# Patient Record
Sex: Male | Born: 1955 | Race: Black or African American | Hispanic: No | Marital: Married | Smoking: Former smoker
Health system: Southern US, Community
[De-identification: ages and names within clinical notes are randomized; demographics above are authoritative.]

## PROBLEM LIST (undated history)

## (undated) DIAGNOSIS — N529 Male erectile dysfunction, unspecified: Secondary | ICD-10-CM

## (undated) DIAGNOSIS — J309 Allergic rhinitis, unspecified: Secondary | ICD-10-CM

## (undated) DIAGNOSIS — I10 Essential (primary) hypertension: Secondary | ICD-10-CM

## (undated) DIAGNOSIS — J9801 Acute bronchospasm: Secondary | ICD-10-CM

## (undated) DIAGNOSIS — J45909 Unspecified asthma, uncomplicated: Secondary | ICD-10-CM

## (undated) DIAGNOSIS — E782 Mixed hyperlipidemia: Secondary | ICD-10-CM

## (undated) DIAGNOSIS — D869 Sarcoidosis, unspecified: Secondary | ICD-10-CM

## (undated) DIAGNOSIS — E291 Testicular hypofunction: Secondary | ICD-10-CM

## (undated) DIAGNOSIS — L309 Dermatitis, unspecified: Secondary | ICD-10-CM

## (undated) DIAGNOSIS — L209 Atopic dermatitis, unspecified: Secondary | ICD-10-CM

## (undated) DIAGNOSIS — M26629 Arthralgia of temporomandibular joint, unspecified side: Secondary | ICD-10-CM

## (undated) DIAGNOSIS — R202 Paresthesia of skin: Secondary | ICD-10-CM

## (undated) DIAGNOSIS — E1165 Type 2 diabetes mellitus with hyperglycemia: Secondary | ICD-10-CM

## (undated) HISTORY — DX: Unspecified asthma, uncomplicated: J45.909

## (undated) HISTORY — DX: Type 2 diabetes mellitus with hyperglycemia: E11.65

## (undated) HISTORY — DX: Male erectile dysfunction, unspecified: N52.9

## (undated) HISTORY — DX: Paresthesia of skin: R20.2

## (undated) HISTORY — DX: Testicular hypofunction: E29.1

## (undated) HISTORY — DX: Mixed hyperlipidemia: E78.2

## (undated) HISTORY — DX: Dermatitis, unspecified: L30.9

## (undated) HISTORY — DX: Essential (primary) hypertension: I10

## (undated) HISTORY — DX: Atopic dermatitis, unspecified: L20.9

## (undated) HISTORY — DX: Arthralgia of temporomandibular joint, unspecified side: M26.629

## (undated) HISTORY — DX: Allergic rhinitis, unspecified: J30.9

## (undated) HISTORY — DX: Acute bronchospasm: J98.01

## (undated) HISTORY — DX: Sarcoidosis, unspecified: D86.9

---

## 2002-09-14 ENCOUNTER — Encounter: Payer: Self-pay | Admitting: *Deleted

## 2002-09-14 ENCOUNTER — Ambulatory Visit (HOSPITAL_COMMUNITY): Admission: RE | Admit: 2002-09-14 | Discharge: 2002-09-14 | Payer: Self-pay | Admitting: *Deleted

## 2002-12-27 ENCOUNTER — Ambulatory Visit (HOSPITAL_BASED_OUTPATIENT_CLINIC_OR_DEPARTMENT_OTHER): Admission: RE | Admit: 2002-12-27 | Discharge: 2002-12-27 | Payer: Self-pay | Admitting: Orthopedic Surgery

## 2004-02-20 ENCOUNTER — Emergency Department (HOSPITAL_COMMUNITY): Admission: EM | Admit: 2004-02-20 | Discharge: 2004-02-20 | Payer: Self-pay | Admitting: Emergency Medicine

## 2006-10-13 ENCOUNTER — Encounter: Admission: RE | Admit: 2006-10-13 | Discharge: 2007-01-11 | Payer: Self-pay | Admitting: Family Medicine

## 2008-07-01 ENCOUNTER — Emergency Department (HOSPITAL_BASED_OUTPATIENT_CLINIC_OR_DEPARTMENT_OTHER): Admission: EM | Admit: 2008-07-01 | Discharge: 2008-07-01 | Payer: Self-pay | Admitting: Emergency Medicine

## 2008-07-01 ENCOUNTER — Ambulatory Visit: Payer: Self-pay | Admitting: Interventional Radiology

## 2010-10-15 LAB — BASIC METABOLIC PANEL
BUN: 18 mg/dL (ref 6–23)
CO2: 30 mEq/L (ref 19–32)
Calcium: 9 mg/dL (ref 8.4–10.5)
Chloride: 102 mEq/L (ref 96–112)
Creatinine, Ser: 1 mg/dL (ref 0.4–1.5)
GFR calc Af Amer: >60 mL/min (ref >60)
GFR calc non Af Amer: >60 mL/min (ref >60)
Glucose, Bld: 102 mg/dL — ABNORMAL HIGH (ref 70–99)
Potassium: 3.4 mEq/L — ABNORMAL LOW (ref 3.5–5.1)
Sodium: 143 mEq/L (ref 135–145)

## 2010-10-15 LAB — DIFFERENTIAL
Basophils Absolute: 0 10*3/uL (ref 0.0–0.1)
Basophils Relative: 1 % (ref 0–1)
Eosinophils Absolute: 0.1 10*3/uL (ref 0.0–0.7)
Eosinophils Relative: 3 % (ref 0–5)
Lymphocytes Relative: 45 % (ref 12–46)
Lymphs Abs: 2.1 10*3/uL (ref 0.7–4.0)
Monocytes Absolute: 0.3 10*3/uL (ref 0.1–1.0)
Monocytes Relative: 8 % (ref 3–12)
Neutro Abs: 1.9 10*3/uL (ref 1.7–7.7)
Neutrophils Relative %: 44 % (ref 43–77)

## 2010-10-15 LAB — CBC
HCT: 34.7 % — ABNORMAL LOW (ref 39.0–52.0)
Hemoglobin: 12.2 g/dL — ABNORMAL LOW (ref 13.0–17.0)
MCHC: 35.1 g/dL (ref 30.0–36.0)
MCV: 84.3 fL (ref 78.0–100.0)
Platelets: 235 10*3/uL (ref 150–400)
RBC: 4.12 MIL/uL — ABNORMAL LOW (ref 4.22–5.81)
RDW: 11.4 % — ABNORMAL LOW (ref 11.5–15.5)
WBC: 4.4 10*3/uL (ref 4.0–10.5)

## 2010-10-15 LAB — URINALYSIS, ROUTINE W REFLEX MICROSCOPIC
Bilirubin Urine: NEGATIVE
Glucose, UA: NEGATIVE mg/dL
Hgb urine dipstick: NEGATIVE
Ketones, ur: 15 mg/dL — AB
Leukocytes, UA: NEGATIVE
Nitrite: NEGATIVE
Protein, ur: 30 mg/dL — AB
Specific Gravity, Urine: 1.033 — ABNORMAL HIGH (ref 1.005–1.030)
Urobilinogen, UA: 2 mg/dL — ABNORMAL HIGH (ref 0.0–1.0)
pH: 6 (ref 5.0–8.0)

## 2010-10-15 LAB — URINE MICROSCOPIC-ADD ON

## 2010-10-15 LAB — URINE CULTURE
Colony Count: NO GROWTH
Culture: NO GROWTH

## 2010-10-15 LAB — LIPASE, BLOOD: Lipase: 133 U/L (ref 23–300)

## 2010-11-16 NOTE — Op Note (Signed)
NAME:  Andrew Parker, Andrew Parker                            ACCOUNT NO.:  [id number]   MEDICAL RECORD NO.:  [id number]                   PATIENT TYPE:  AMB   LOCATION:  DSC                                  FACILITY:  [facility]   PHYSICIAN:  Feliberto Gottron. Turner Daniels, M.D.                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   POSTOPERATIVE DIAGNOSES:  Right shoulder rotator cuff partial tear with  impingement syndrome.   OPERATION PERFORMED:  Right shoulder arthroscopic anterior inferior  acromioplasty.  Excision of distal clavicle spur and debridement of internal  leaflet rotator cuff tear.   SURGEON:  Feliberto Gottron. Turner Daniels, M.D.   ASSISTANTLaural Benes. Jannet Mantis.   ANESTHESIA:  Interscalene block plus general endotracheal.   ESTIMATED BLOOD LOSS:  Minimal.   FLUIDS REPLACED:  of crystalloid.   DRAINS:  None.   TOURNIQUET TIME:  None.   INDICATIONS FOR PROCEDURE:  The patient is a 55 year old man who fell off a  ladder in February of 2004 and has had right shoulder pain ever since  consistent with impingement syndrome. MRI scan showed a 1 cm retracted  rotator cuff tear and he failed conservative treatment.  He is retired from  his primary job at this point and is doing work around his house and he has  symptoms when he attempts overhead activities.  He has failed conservative  treatment with observation and exercise. He desires elective arthroscopic  evaluation and treatment of his right shoulder.   DESCRIPTION OF PROCEDURE:  The patient was identified by arm band and taken  to the operating room at [facility] where appropriate  anesthetic monitors are attached and interscalene block anesthesia induced  into the right upper extremity followed by general endotracheal anesthesia.  He was then placed  in the beach chair position and the right upper extremity  was prepped and draped in the usual sterile fashion from the wrist to the  hemithorax.  Using a #11 blade, we then made standard portals 1.5 cm  anterior to the acromioclavicular joint lateral to the junction of the  middle and posterior thirds of the acromion and posterior to the  posterolateral corner of the acromion process.  The inflow was placed  anteriorly.  The arthroscope laterally and a 4.2 great white sucker shaver  posteriorly.  This allowed removal of inflamed, thickened subacromial bursa,  outlining of the subclavicular and subacromial spurs which were then removed  with a 4.5 hooded vortex bur approaching from the posterior portal and  creating a type 1 acromion.  The [facility] joint still had a layer of cartilage in  it and we did not proceed with a distal clavicle excision.  The rotator  cuff  was carefully evaluated and probed and no full thickness tears externally  were noted.  The arthroscope was then repositioned into the glenohumeral  joint using the posterior portal with the inflow attached.  The labrum had  some minor tearing and was lightly debrided.  The biceps and biceps anchor  were intact.  The subscap was intact.  The rotator cuff had an internal  leaflet partial thickness tear that was not full thickness and in my opinion  was less than 50% torn.  This was debrided back to a stable margin and once  again there was a watertight seal through the rotator cuff.  At this point  the shoulder was irrigated with normal saline solution using the arthroscope  and the instruments were removed.  A dressing of Xeroform, 4 x 4 dressing  sponges and HypaFix tape.  Patient was placed in a sling, awakened and taken  to the recovery room without difficulty.                                                  Feliberto Gottron. Turner Daniels, M.D.    Ovid Curd  D:  12/27/2002  T:  12/27/2002  Job:  [id number]

## 2011-04-12 ENCOUNTER — Other Ambulatory Visit: Payer: Self-pay | Admitting: Family Medicine

## 2011-04-12 DIAGNOSIS — M25512 Pain in left shoulder: Secondary | ICD-10-CM

## 2011-04-16 ENCOUNTER — Ambulatory Visit
Admission: RE | Admit: 2011-04-16 | Discharge: 2011-04-16 | Disposition: A | Discharge status: Home / Self Care | Payer: BC Managed Care – PPO | Source: Ambulatory Visit | Attending: Family Medicine | Admitting: Family Medicine

## 2011-04-16 DIAGNOSIS — M25512 Pain in left shoulder: Secondary | ICD-10-CM

## 2017-03-17 ENCOUNTER — Ambulatory Visit
Admission: RE | Admit: 2017-03-17 | Discharge: 2017-03-17 | Disposition: A | Discharge status: Home / Self Care | Payer: BLUE CROSS/BLUE SHIELD | Source: Ambulatory Visit | Attending: Family Medicine | Admitting: Family Medicine

## 2017-03-17 ENCOUNTER — Other Ambulatory Visit: Payer: Self-pay | Admitting: Family Medicine

## 2017-03-17 DIAGNOSIS — R1031 Right lower quadrant pain: Secondary | ICD-10-CM

## 2018-05-25 ENCOUNTER — Other Ambulatory Visit: Payer: Self-pay | Admitting: Family Medicine

## 2018-05-25 ENCOUNTER — Ambulatory Visit
Admission: RE | Admit: 2018-05-25 | Discharge: 2018-05-25 | Disposition: A | Discharge status: Home / Self Care | Payer: BLUE CROSS/BLUE SHIELD | Source: Ambulatory Visit | Attending: Family Medicine | Admitting: Family Medicine

## 2018-05-25 DIAGNOSIS — R634 Abnormal weight loss: Secondary | ICD-10-CM

## 2018-05-25 DIAGNOSIS — M545 Low back pain, unspecified: Secondary | ICD-10-CM

## 2018-05-26 ENCOUNTER — Other Ambulatory Visit: Payer: Self-pay | Admitting: Family Medicine

## 2018-05-26 DIAGNOSIS — R634 Abnormal weight loss: Secondary | ICD-10-CM

## 2018-05-26 DIAGNOSIS — R748 Abnormal levels of other serum enzymes: Secondary | ICD-10-CM

## 2018-06-02 ENCOUNTER — Ambulatory Visit
Admission: RE | Admit: 2018-06-02 | Discharge: 2018-06-02 | Disposition: A | Discharge status: Home / Self Care | Payer: BLUE CROSS/BLUE SHIELD | Source: Ambulatory Visit | Attending: Family Medicine | Admitting: Family Medicine

## 2018-06-02 DIAGNOSIS — R748 Abnormal levels of other serum enzymes: Secondary | ICD-10-CM

## 2018-06-02 DIAGNOSIS — R634 Abnormal weight loss: Secondary | ICD-10-CM

## 2018-06-02 MED ORDER — IOHEXOL 300 MG/ML  SOLN
100.0000 mL | Freq: Once | INTRAMUSCULAR | Status: AC | PRN
  Administered 2018-06-02: 100 mL via INTRAVENOUS

## 2019-12-09 DIAGNOSIS — D649 Anemia, unspecified: Secondary | ICD-10-CM | POA: Diagnosis not present

## 2019-12-09 DIAGNOSIS — E1169 Type 2 diabetes mellitus with other specified complication: Secondary | ICD-10-CM | POA: Diagnosis not present

## 2019-12-09 DIAGNOSIS — I10 Essential (primary) hypertension: Secondary | ICD-10-CM | POA: Diagnosis not present

## 2019-12-09 DIAGNOSIS — E782 Mixed hyperlipidemia: Secondary | ICD-10-CM | POA: Diagnosis not present

## 2020-06-15 DIAGNOSIS — Z0000 Encounter for general adult medical examination without abnormal findings: Secondary | ICD-10-CM | POA: Diagnosis not present

## 2020-06-15 DIAGNOSIS — E1165 Type 2 diabetes mellitus with hyperglycemia: Secondary | ICD-10-CM | POA: Diagnosis not present

## 2020-06-15 DIAGNOSIS — I10 Essential (primary) hypertension: Secondary | ICD-10-CM | POA: Diagnosis not present

## 2020-06-15 DIAGNOSIS — E782 Mixed hyperlipidemia: Secondary | ICD-10-CM | POA: Diagnosis not present

## 2020-11-22 IMAGING — CT CT ABD-PELV W/ CM
1 of 3 series · 14 of 32 positions shown, 19 images · IV contrast (APPLIED)
Comparison: 07/01/2008

CLINICAL DATA: Weight loss. Elevated lipase. Right lateral thigh
pain and numbness.

EXAM:
CT ABDOMEN AND PELVIS WITH CONTRAST
TECHNIQUE: Multidetector CT imaging of the abdomen and pelvis was performed
using the standard protocol following bolus administration of
intravenous contrast.
CONTRAST:  100mL OMNIPAQUE IOHEXOL 300 MG/ML  SOLN

[Series 2: abd/pelvis w/cm · axial · 0.77mm/px · z∈[-494,-74]mm · 14 of 98 slices shown, 19 images]
[im 7/98  soft-tissue]
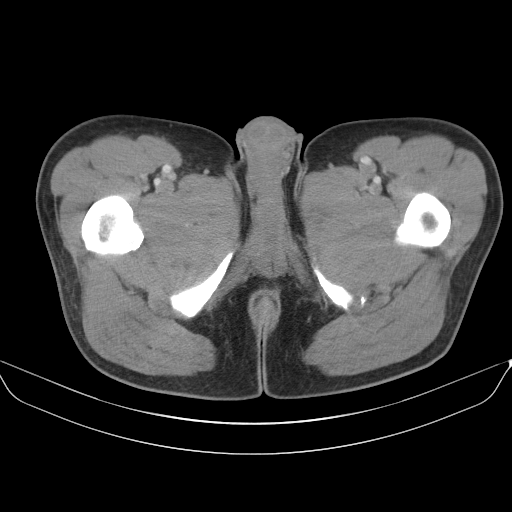
[im 7/98  bone]
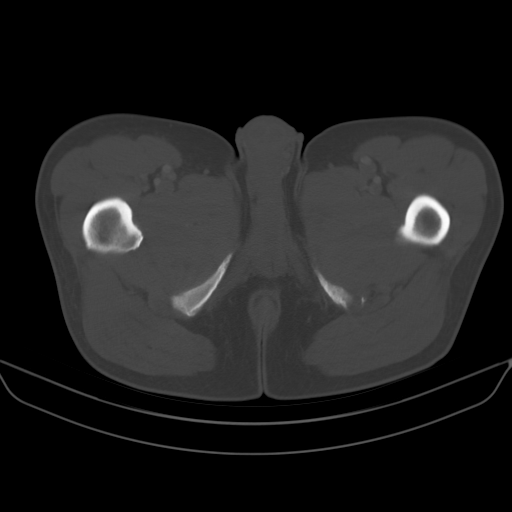
[im 13/98  soft-tissue]
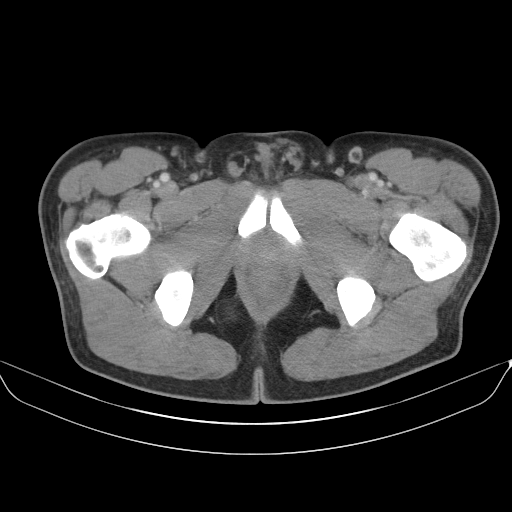
[im 19/98  soft-tissue]
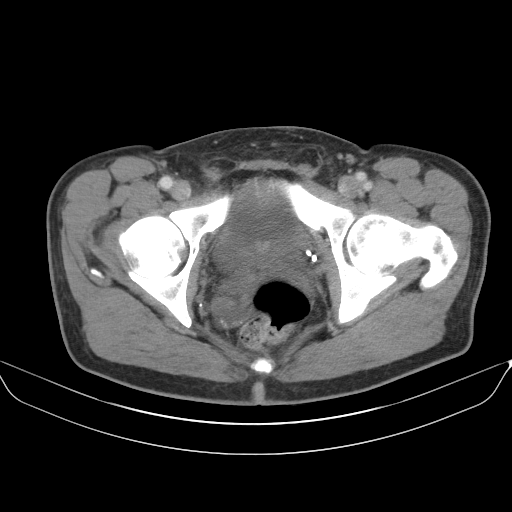
[im 31/98  soft-tissue]
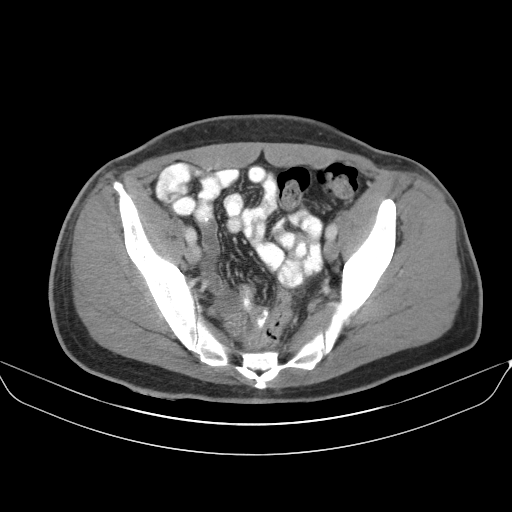
[im 37/98  soft-tissue]
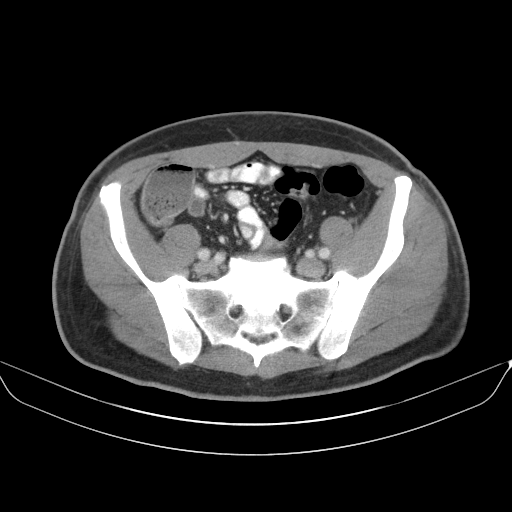
[im 43/98  soft-tissue]
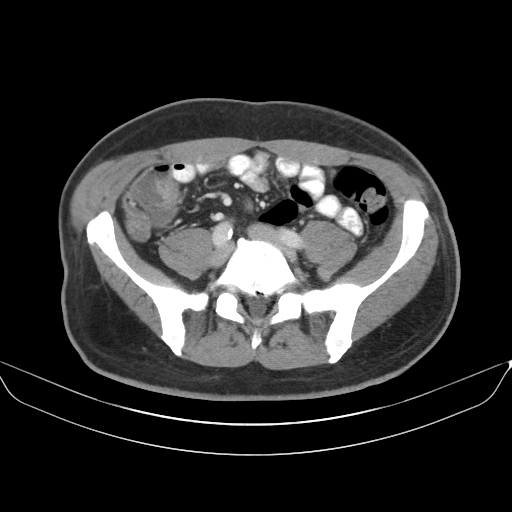
[im 49/98  soft-tissue]
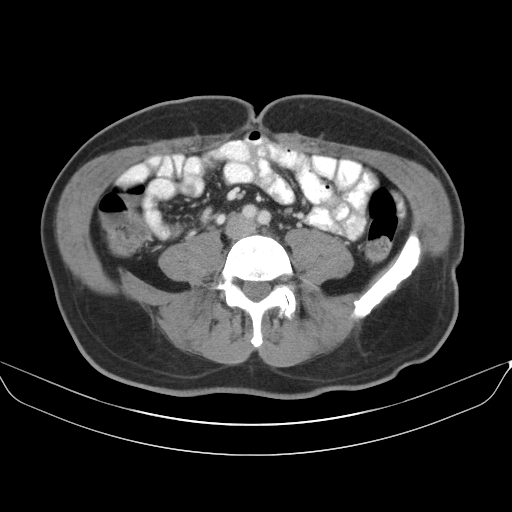
[im 55/98  soft-tissue]
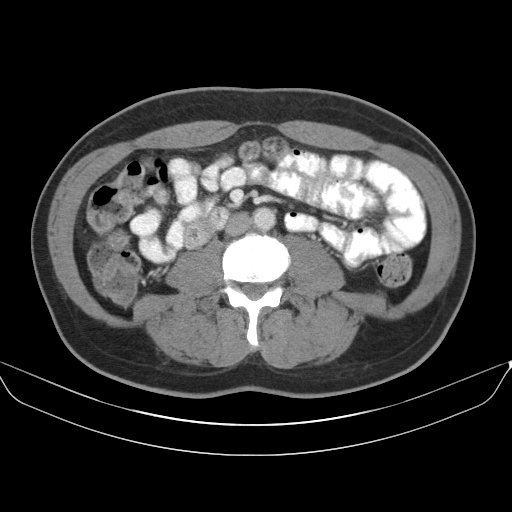
[im 61/98  soft-tissue]
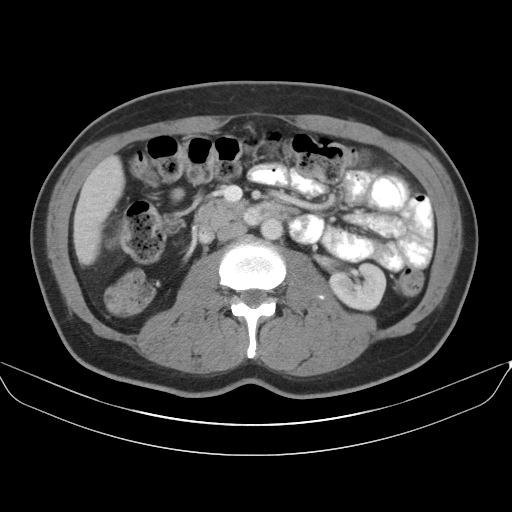
[im 61/98  bone]
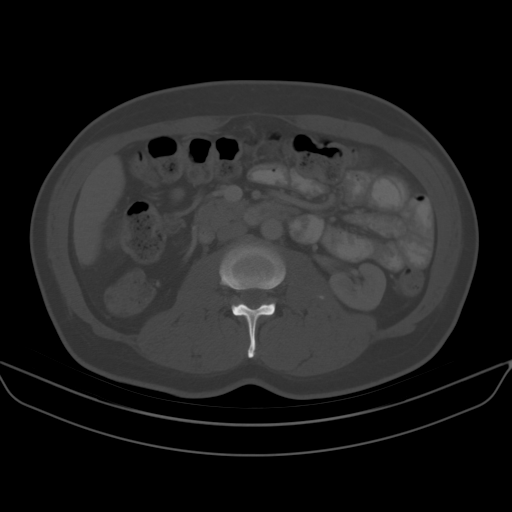
[im 67/98  soft-tissue]
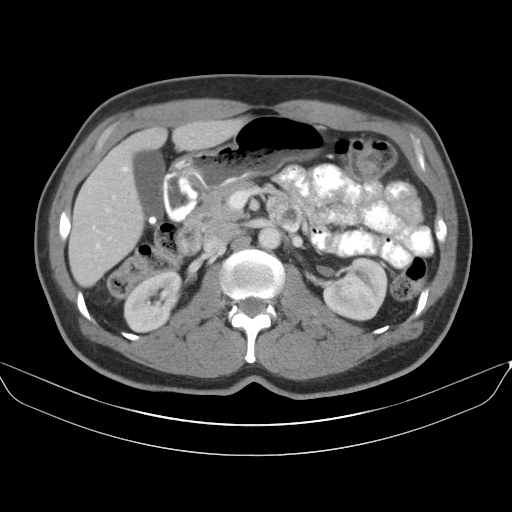
[im 73/98  lung]
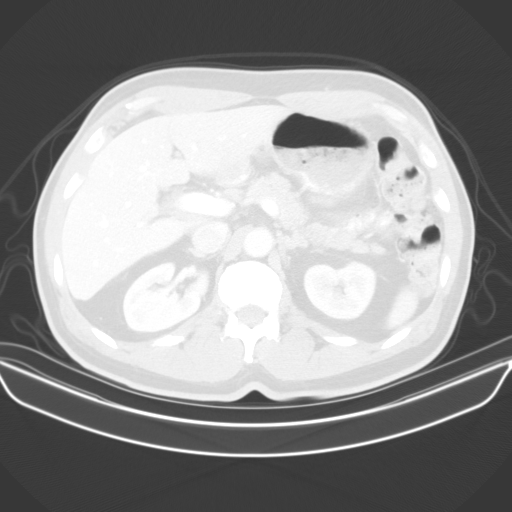
[im 79/98  soft-tissue]
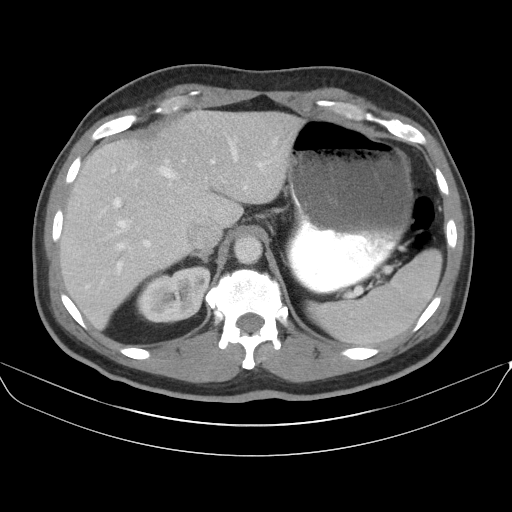
[im 79/98  lung]
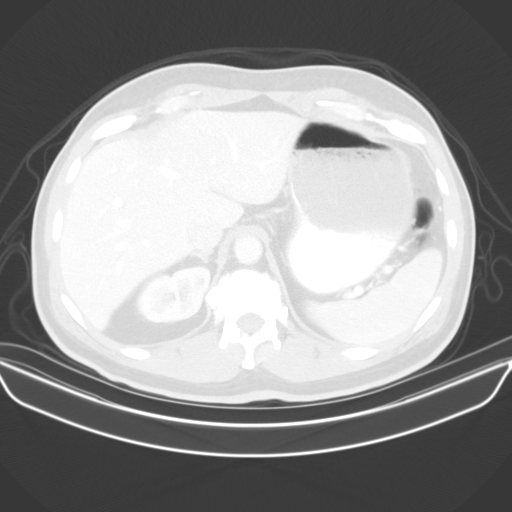
[im 85/98  soft-tissue]
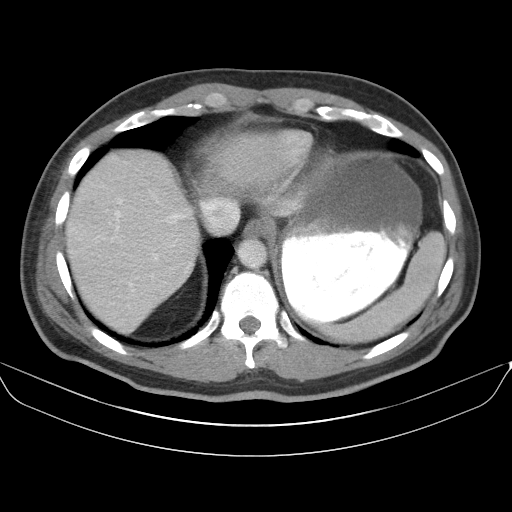
[im 85/98  lung]
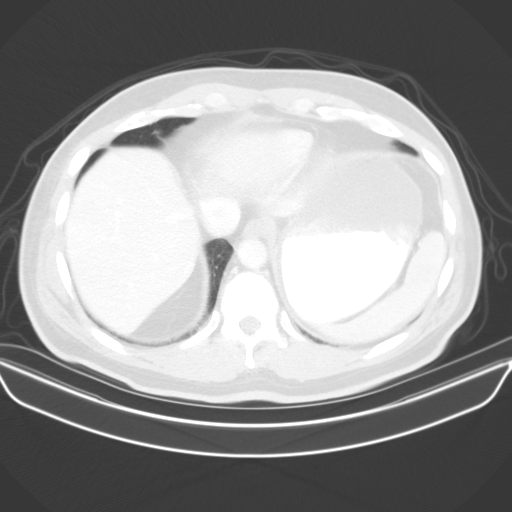
[im 91/98  soft-tissue]
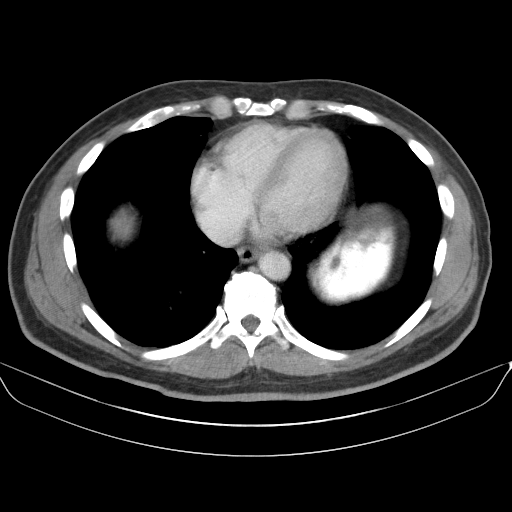
[im 91/98  lung]
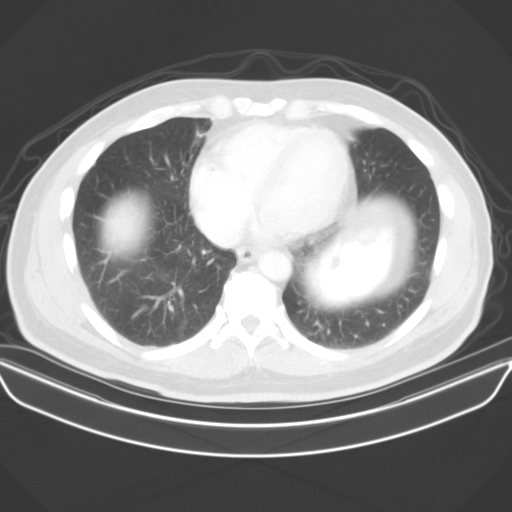

[14 of 32 positions shown; findings below may reference images not displayed]

FINDINGS: Lower chest: No acute abnormality.

Hepatobiliary: No focal liver abnormality is seen. Gallstones noted
measuring up to 9mm. No gallbladder wall thickening or
pericholecystic fluid.

Pancreas: Unremarkable. No pancreatic ductal dilatation or
surrounding inflammatory changes.

Spleen: Normal in size without focal abnormality.

Adrenals/Urinary Tract: Adrenal glands are unremarkable. Small
bilateral low attention foci noted measuring less than 10mm, too
small to characterize. Kidneys are otherwise normal, without renal
calculi, focal lesion, or hydronephrosis. Bladder is unremarkable.

Stomach/Bowel: Stomach appears normal. Small bowel loops have a
normal course and caliber. No small bowel wall thickening or
inflammation. The appendix is visualized and appears normal.
Unremarkable appearance of the colon.

Vascular/Lymphatic: No significant vascular findings are present. No
enlarged abdominal or pelvic lymph nodes.

Reproductive: Prostate is unremarkable.

Other: No abdominal wall hernia or abnormality. No abdominopelvic
ascites.

Musculoskeletal: No acute or significant osseous findings. L5-S1
degenerative disc disease noted.
IMPRESSION: 1. No acute findings within the abdomen or pelvis.
2. Gallstones.
3. No mass or adenopathy identified to explain patient's loss of
weight.

## 2020-12-07 DIAGNOSIS — R0602 Shortness of breath: Secondary | ICD-10-CM | POA: Diagnosis not present

## 2020-12-07 DIAGNOSIS — R0789 Other chest pain: Secondary | ICD-10-CM | POA: Diagnosis not present

## 2020-12-11 DIAGNOSIS — R0600 Dyspnea, unspecified: Secondary | ICD-10-CM | POA: Diagnosis not present

## 2020-12-11 DIAGNOSIS — J45909 Unspecified asthma, uncomplicated: Secondary | ICD-10-CM | POA: Diagnosis not present

## 2021-01-03 ENCOUNTER — Ambulatory Visit: Payer: Medicare Other | Admitting: Interventional Cardiology

## 2021-01-03 ENCOUNTER — Other Ambulatory Visit: Payer: Self-pay

## 2021-01-03 ENCOUNTER — Encounter: Payer: Self-pay | Admitting: Interventional Cardiology

## 2021-01-03 VITALS — BP 136/90 | HR 73 | Ht 69.0 in | Wt 204.2 lb

## 2021-01-03 DIAGNOSIS — E782 Mixed hyperlipidemia: Secondary | ICD-10-CM

## 2021-01-03 DIAGNOSIS — R072 Precordial pain: Secondary | ICD-10-CM

## 2021-01-03 DIAGNOSIS — I10 Essential (primary) hypertension: Secondary | ICD-10-CM

## 2021-01-03 DIAGNOSIS — E119 Type 2 diabetes mellitus without complications: Secondary | ICD-10-CM | POA: Diagnosis not present

## 2021-01-03 MED ORDER — METOPROLOL TARTRATE 100 MG PO TABS: ORAL_TABLET | ORAL | 0 refills | Status: DC

## 2021-01-03 NOTE — Patient Instructions (Addendum)
Medication Instructions:  Your physician recommends that you continue on your current medications as directed. Please refer to the Current Medication list given to you today.  *If you need a refill on your cardiac medications before your next appointment, please call your pharmacy*   Lab Work: Lab work to be done today--BMP If you have labs (blood work) drawn today and your tests are completely normal, you will receive your results only by: MyChart Message (if you have MyChart) OR A paper copy in the mail If you have any lab test that is abnormal or we need to change your treatment, we will call you to review the results.   Testing/Procedures: Your physician has requested that you have cardiac CT. Cardiac computed tomography (CT) is a painless test that uses an x-ray machine to take clear, detailed pictures of your heart. For further information please visit [email or web address] Please follow instruction sheet as given.     Follow-Up: At [facility], you and your health needs are our priority.  As part of our continuing mission to provide you with exceptional heart care, we have created designated Provider Care Teams.  These Care Teams include your primary Cardiologist (physician) and Advanced Practice Providers (APPs -  Physician Assistants and Nurse Practitioners) who all work together to provide you with the care you need, when you need it.  We recommend signing up for the patient portal called "MyChart".  Sign up information is provided on this After Visit Summary.  MyChart is used to connect with patients for Virtual Visits (Telemedicine).  Patients are able to view lab/test results, encounter notes, upcoming appointments, etc.  Non-urgent messages can be sent to your provider as well.   To learn more about what you can do with MyChart, go to [email or web address].    Your next appointment:   Based on test results  The format for your next appointment:   In Person  Provider:    You may see Lance Muss, MD or one of the following Advanced Practice Providers on your designated Care Team:   Ronie Spies, PA-C Jacolyn Reedy, PA-C   Other Instructions     Your cardiac CT will be scheduled at one of the below locations:   [facility] [address] [place], [place] [address] [phone]  OR  [facility] [address] [place], [place] [address] [phone]  If scheduled at [facility], please arrive at the [facility] main entrance (entrance A) of [facility] 30 minutes prior to test start time. Proceed to the [facility] Radiology Department (first floor) to check-in and test prep.  If scheduled at [facility], please arrive 15 mins early for check-in and test prep.  Please follow these instructions carefully (unless otherwise directed):  Hold all erectile dysfunction medications at least 3 days (72 hrs) prior to test.  On the Night Before the Test: Be sure to Drink plenty of water. Do not consume any caffeinated/decaffeinated beverages or chocolate 12 hours prior to your test. Do not take any antihistamines 12 hours prior to your test.   On the Day of the Test: Drink plenty of water until 1 hour prior to the test. Do not eat any food 4 hours prior to the test. You may take your regular medications prior to the test.  Take metoprolol (Lopressor) two hours prior to test.--Dose is 100 mg HOLD Furosemide/Hydrochlorothiazide morning of the test.       After the  Test: Drink plenty of water. After receiving IV contrast, you may experience a mild flushed feeling. This is normal. On occasion, you may experience a mild rash up to 24 hours after the test. This is not dangerous. If this occurs, you can take Benadryl 25 mg and increase your fluid intake. If you experience trouble breathing, this can be serious. If it is severe call 911 IMMEDIATELY.  If it is mild, please call our office. If you take any of these medications: Glipizide/Metformin, Avandament, Glucavance, please do not take 48 hours after completing test unless otherwise instructed.   Once we have confirmed authorization from your insurance company, we will call you to set up a date and time for your test. Based on how quickly your insurance processes prior authorizations requests, please allow up to 4 weeks to be contacted for scheduling your Cardiac CT appointment. Be advised that routine Cardiac CT appointments could be scheduled as many as 8 weeks after your provider has ordered it.  For non-scheduling related questions, please contact the cardiac imaging nurse navigator should you have any questions/concerns: Rockwell Alexandria, Cardiac Imaging Nurse Navigator Larey Brick, Cardiac Imaging Nurse Navigator [facility] Direct Office Dial: [phone]   For scheduling needs, including cancellations and rescheduling, please call [place], [phone].

## 2021-01-03 NOTE — Progress Notes (Signed)
Cardiology Office Note   Date:  01/03/2021   ID:  Andrew Parker, DOB [birth year], MRN [id number]  PCP:  Tally Joe, MD    No chief complaint on file.  DOE  [employer] Readings from Last 3 Encounters:  01/03/21 204 lb 3.2 oz (92.6 kg)       History of Present Illness: Andrew Parker is a 65 y.o. male who is being seen today for the evaluation of DOE at the request of Tally Joe, MD.   Worse in the heat.  He feels that he coughs more at night.  He has to sit up at night for some relief.  He has some fatigue and sleepy.  He has some chest tightness daily.  He works 8 hours/day and is able to complete his job.  He walks a fair amount at work.    Denies : Dizziness. Leg edema. Nitroglycerin use. Palpitations. Syncope.     No regular exercise.    Family h/o CAD included sister with MIx2.  Uncles have had MI as well.   No cardiac testing that he has had in the past.   No smoking.  He does have asthma since he was a child.      Past Medical History:  Diagnosis Date   Allergic rhinitis    Asthma    Bronchospasm    Dermatitis, atopic    Eczema    ED (erectile dysfunction)    Hypertension    Hypogonadism in male    Mixed hyperlipidemia    Paresthesia    Sarcoidosis    TMJ syndrome    Type 2 diabetes mellitus with hyperglycemia (HCC)        Current Outpatient Medications  Medication Sig Dispense Refill   levalbuterol (XOPENEX HFA) 45 MCG/ACT inhaler Inhale into the lungs.     losartan-hydrochlorothiazide (HYZAAR) 100-12.5 MG tablet TK 1/2 T PO QD     metformin (FORTAMET) 1000 MG (OSM) 24 hr tablet Take by mouth.     sildenafil (REVATIO) 20 MG tablet Take 40-100 mg by mouth daily as needed.     simvastatin (ZOCOR) 20 MG tablet Take 20 mg by mouth daily.     No current facility-administered medications for this visit.    Allergies:   Patient has no known allergies.    Social History:  The patient  reports that he quit smoking about 40 years ago. His smoking use  included cigarettes. He has a 6.00 pack-year smoking history. He has never used smokeless tobacco. He reports that he does not drink alcohol and does not use drugs.   Family History:  The patient's family history includes Diabetes in his mother; Heart attack in his sister; High Cholesterol in his mother; Hypercholesterolemia in his sister; Hypertension in his mother and sister; Lupus in his mother; Stroke in his paternal grandfather and sister.    ROS:  Please see the history of present illness.   Otherwise, review of systems are positive for DOE.   All other systems are reviewed and negative.    PHYSICAL EXAM: VS:  BP 136/90   Pulse 73   Ht 5\' 9"  (1.753 m)   Wt 204 lb 3.2 oz (92.6 kg)   SpO2 98%   BMI 30.16 kg/m  , BMI Body mass index is 30.16 kg/m. GEN: Well nourished, well developed, in no acute distress HEENT: normal Neck: no JVD, carotid bruits, or masses Cardiac: RRR; no murmurs, rubs, or gallops,no edema  Respiratory:  minimal wheezing on right, normal  work of breathing GI: soft, nontender, nondistended, + BS MS: no deformity or atrophy Skin: warm and dry, no rash Neuro:  Strength and sensation are intact Psych: euthymic mood, full affect   EKG:   The ekg ordered 12/21 at [facility] demonstrates normal ECG per report, tracing not available   Recent Labs: No results found for requested labs within last 8760 hours.   Lipid Panel No results found for: CHOL, TRIG, HDL, CHOLHDL, VLDL, LDLCALC, LDLDIRECT   Other studies Reviewed: Additional studies/ records that were reviewed today with results demonstrating: labs reviewed.   ASSESSMENT AND PLAN:  Precordial chest pain: Associated SHOB.  Multiple RF for CAD. Plan for CTA.   MEtoprolol prior to reduce HR. HTN: Needs to increase exercise and decrease salt intake to help reduce diastolic.  DM: Whole food, plant based diet.  High fiber.  Hemoglobin A1c 7.5.  He will benefit from increased exercise once his cardiac work-up is  complete. Hyperlipidemia: Continue simvastatin.  Dietary changes as noted above.   Current medicines are reviewed at length with the patient today.  The patient concerns regarding his medicines were addressed.  The following changes have been made:  No change  Labs/ tests ordered today include:  No orders of the defined types were placed in this encounter.   Recommend 150 minutes/week of aerobic exercise Low fat, low carb, high fiber diet recommended  Disposition:   FU for CTA   Signed, Lance Muss, MD  01/03/2021 2:50 PM    [facility] [address] [place], [place], [place]  [address] Phone: [phone]; Fax: [phone]

## 2021-01-04 LAB — BASIC METABOLIC PANEL
BUN/Creatinine Ratio: 17 (ref 10–24)
BUN: 18 mg/dL (ref 8–27)
CO2: 24 mmol/L (ref 20–29)
Calcium: 9.4 mg/dL (ref 8.6–10.2)
Chloride: 102 mmol/L (ref 96–106)
Creatinine, Ser: 1.05 mg/dL (ref 0.76–1.27)
Glucose: 157 mg/dL — ABNORMAL HIGH (ref 65–99)
Potassium: 4.2 mmol/L (ref 3.5–5.2)
Sodium: 140 mmol/L (ref 134–144)
eGFR: 78 mL/min/{1.73_m2} (ref >59)

## 2021-01-09 ENCOUNTER — Telehealth (HOSPITAL_COMMUNITY): Payer: Self-pay | Admitting: Emergency Medicine

## 2021-01-09 NOTE — Telephone Encounter (Signed)
Reaching out to patient to offer assistance regarding upcoming cardiac imaging study; pt verbalizes understanding of appt date/time, parking situation and where to check in, pre-test NPO status and medications ordered, and verified current allergies; name and call back number provided for further questions should they arise Rockwell Alexandria RN Navigator Cardiac Imaging [facility] [phone] office [phone] cell  Holding hyzaar, revatio Taking 100mg  metoprolol tart Unable to ask about IV issues, claustro 

## 2021-01-11 ENCOUNTER — Ambulatory Visit (HOSPITAL_COMMUNITY)
Admission: RE | Admit: 2021-01-11 | Discharge: 2021-01-11 | Disposition: A | Discharge status: Home / Self Care | Payer: Medicare Other | Source: Ambulatory Visit | Attending: Interventional Cardiology | Admitting: Interventional Cardiology

## 2021-01-11 ENCOUNTER — Other Ambulatory Visit: Payer: Self-pay

## 2021-01-11 ENCOUNTER — Encounter (HOSPITAL_COMMUNITY): Payer: Self-pay

## 2021-01-11 DIAGNOSIS — R072 Precordial pain: Secondary | ICD-10-CM | POA: Diagnosis not present

## 2021-01-11 MED ORDER — IOHEXOL 350 MG/ML SOLN
95.0000 mL | Freq: Once | INTRAVENOUS | Status: AC | PRN
  Administered 2021-01-11: 95 mL via INTRAVENOUS

## 2021-01-11 MED ORDER — NITROGLYCERIN 0.4 MG SL SUBL
0.8000 mg | SUBLINGUAL_TABLET | Freq: Once | SUBLINGUAL | Status: AC
  Administered 2021-01-11: 0.8 mg via SUBLINGUAL

## 2021-01-11 MED ORDER — NITROGLYCERIN 0.4 MG SL SUBL
SUBLINGUAL_TABLET | SUBLINGUAL | Status: AC
  Filled 2021-01-11: qty 2

## 2021-01-11 MED ORDER — METOPROLOL TARTRATE 5 MG/5ML IV SOLN: 10.0000 mg | INTRAVENOUS | Status: DC | PRN

## 2021-01-16 ENCOUNTER — Telehealth: Payer: Self-pay

## 2021-01-16 DIAGNOSIS — E782 Mixed hyperlipidemia: Secondary | ICD-10-CM

## 2021-01-16 DIAGNOSIS — Z79899 Other long term (current) drug therapy: Secondary | ICD-10-CM

## 2021-01-16 MED ORDER — ROSUVASTATIN CALCIUM 20 MG PO TABS: 20.0000 mg | ORAL_TABLET | Freq: Every day | ORAL | 3 refills | Status: AC

## 2021-01-16 NOTE — Telephone Encounter (Signed)
Verbalized understanding of his CT results and agrees to stopping Zocor and adding Rosuvastatin. Will have repeat fasting labs 04/18/21.

## 2021-01-16 NOTE — Telephone Encounter (Signed)
-----   Message from Corky Crafts, MD sent at 01/12/2021 11:11 PM EDT ----- Nonobstructive CAD but high calcium score and percentile.  Stop Zocor.  Start rosuvastatin 20 mg daily.  Liver and lipid tests in 3 months.

## 2021-02-23 ENCOUNTER — Observation Stay (HOSPITAL_COMMUNITY)
Admission: EM | Admit: 2021-02-23 | Discharge: 2021-02-26 | Disposition: A | Discharge status: Home / Self Care | Payer: Medicare Other | Attending: Internal Medicine | Admitting: Internal Medicine

## 2021-02-23 ENCOUNTER — Emergency Department (HOSPITAL_COMMUNITY): Payer: Medicare Other

## 2021-02-23 ENCOUNTER — Other Ambulatory Visit: Payer: Self-pay

## 2021-02-23 ENCOUNTER — Encounter (HOSPITAL_COMMUNITY): Payer: Self-pay

## 2021-02-23 DIAGNOSIS — E119 Type 2 diabetes mellitus without complications: Secondary | ICD-10-CM | POA: Diagnosis not present

## 2021-02-23 DIAGNOSIS — U071 COVID-19: Secondary | ICD-10-CM | POA: Diagnosis not present

## 2021-02-23 DIAGNOSIS — E782 Mixed hyperlipidemia: Secondary | ICD-10-CM | POA: Diagnosis present

## 2021-02-23 DIAGNOSIS — I2510 Atherosclerotic heart disease of native coronary artery without angina pectoris: Secondary | ICD-10-CM | POA: Insufficient documentation

## 2021-02-23 DIAGNOSIS — Z7984 Long term (current) use of oral hypoglycemic drugs: Secondary | ICD-10-CM | POA: Insufficient documentation

## 2021-02-23 DIAGNOSIS — I10 Essential (primary) hypertension: Secondary | ICD-10-CM | POA: Diagnosis not present

## 2021-02-23 DIAGNOSIS — D649 Anemia, unspecified: Secondary | ICD-10-CM | POA: Diagnosis present

## 2021-02-23 DIAGNOSIS — Z79899 Other long term (current) drug therapy: Secondary | ICD-10-CM | POA: Diagnosis not present

## 2021-02-23 DIAGNOSIS — R0602 Shortness of breath: Secondary | ICD-10-CM | POA: Insufficient documentation

## 2021-02-23 DIAGNOSIS — R55 Syncope and collapse: Principal | ICD-10-CM | POA: Diagnosis present

## 2021-02-23 DIAGNOSIS — E1165 Type 2 diabetes mellitus with hyperglycemia: Secondary | ICD-10-CM | POA: Diagnosis present

## 2021-02-23 DIAGNOSIS — J9811 Atelectasis: Secondary | ICD-10-CM | POA: Diagnosis not present

## 2021-02-23 DIAGNOSIS — Z87891 Personal history of nicotine dependence: Secondary | ICD-10-CM | POA: Insufficient documentation

## 2021-02-23 DIAGNOSIS — J45909 Unspecified asthma, uncomplicated: Secondary | ICD-10-CM | POA: Diagnosis not present

## 2021-02-23 LAB — URINALYSIS, ROUTINE W REFLEX MICROSCOPIC
Bilirubin Urine: NEGATIVE
Glucose, UA: 50 mg/dL — AB
Hgb urine dipstick: NEGATIVE
Ketones, ur: 5 mg/dL — AB
Leukocytes,Ua: NEGATIVE
Nitrite: NEGATIVE
Protein, ur: NEGATIVE mg/dL
Specific Gravity, Urine: 1.035 — ABNORMAL HIGH (ref 1.005–1.030)
pH: 5 (ref 5.0–8.0)

## 2021-02-23 LAB — BASIC METABOLIC PANEL
Anion gap: 8 (ref 5–15)
BUN: 18 mg/dL (ref 8–23)
CO2: 25 mmol/L (ref 22–32)
Calcium: 8.6 mg/dL — ABNORMAL LOW (ref 8.9–10.3)
Chloride: 99 mmol/L (ref 98–111)
Creatinine, Ser: 1.15 mg/dL (ref 0.61–1.24)
GFR, Estimated: >60 mL/min (ref >60)
Glucose, Bld: 245 mg/dL — ABNORMAL HIGH (ref 70–99)
Potassium: 3.9 mmol/L (ref 3.5–5.1)
Sodium: 132 mmol/L — ABNORMAL LOW (ref 135–145)

## 2021-02-23 LAB — RESP PANEL BY RT-PCR (FLU A&B, COVID) ARPGX2
Influenza A by PCR: NEGATIVE
Influenza B by PCR: NEGATIVE
SARS Coronavirus 2 by RT PCR: POSITIVE — AB

## 2021-02-23 LAB — PROCALCITONIN: Procalcitonin: <0.1 ng/mL

## 2021-02-23 LAB — CBG MONITORING, ED
Glucose-Capillary: 251 mg/dL — ABNORMAL HIGH (ref 70–99)
Glucose-Capillary: 116 mg/dL — ABNORMAL HIGH (ref 70–99)
Glucose-Capillary: 231 mg/dL — ABNORMAL HIGH (ref 70–99)

## 2021-02-23 LAB — D-DIMER, QUANTITATIVE: D-Dimer, Quant: 0.95 ug/mL-FEU — ABNORMAL HIGH (ref 0.00–0.50)

## 2021-02-23 LAB — TROPONIN I (HIGH SENSITIVITY)
Troponin I (High Sensitivity): 3 ng/L (ref <18)
Troponin I (High Sensitivity): 4 ng/L (ref <18)

## 2021-02-23 LAB — CBC
HCT: 28.8 % — ABNORMAL LOW (ref 39.0–52.0)
Hemoglobin: 10.5 g/dL — ABNORMAL LOW (ref 13.0–17.0)
MCH: 29.4 pg (ref 26.0–34.0)
MCHC: 36.5 g/dL — ABNORMAL HIGH (ref 30.0–36.0)
MCV: 80.7 fL (ref 80.0–100.0)
Platelets: 186 10*3/uL (ref 150–400)
RBC: 3.57 MIL/uL — ABNORMAL LOW (ref 4.22–5.81)
RDW: 11.3 % — ABNORMAL LOW (ref 11.5–15.5)
WBC: 7.5 10*3/uL (ref 4.0–10.5)
nRBC: 0 % (ref 0.0–0.2)

## 2021-02-23 LAB — FERRITIN
Ferritin: 103 ng/mL (ref 24–336)
Ferritin: 98 ng/mL (ref 24–336)

## 2021-02-23 LAB — BRAIN NATRIURETIC PEPTIDE: B Natriuretic Peptide: 60.3 pg/mL (ref 0.0–100.0)

## 2021-02-23 LAB — IRON AND TIBC
Iron: 57 ug/dL (ref 45–182)
Saturation Ratios: 17 % — ABNORMAL LOW (ref 17.9–39.5)
TIBC: 337 ug/dL (ref 250–450)
UIBC: 280 ug/dL

## 2021-02-23 LAB — PHOSPHORUS: Phosphorus: 3.1 mg/dL (ref 2.5–4.6)

## 2021-02-23 LAB — C-REACTIVE PROTEIN: CRP: 4.7 mg/dL — ABNORMAL HIGH (ref <1.0)

## 2021-02-23 LAB — FIBRINOGEN: Fibrinogen: 423 mg/dL (ref 210–475)

## 2021-02-23 LAB — TSH: TSH: 0.465 u[IU]/mL (ref 0.350–4.500)

## 2021-02-23 LAB — MAGNESIUM: Magnesium: 1.4 mg/dL — ABNORMAL LOW (ref 1.7–2.4)

## 2021-02-23 MED ORDER — INSULIN ASPART 100 UNIT/ML IJ SOLN
0.0000 [IU] | Freq: Every day | INTRAMUSCULAR | Status: DC
  Administered 2021-02-23 – 2021-02-25 (×3): 2 [IU] via SUBCUTANEOUS
  Filled 2021-02-23: qty 0.05

## 2021-02-23 MED ORDER — ENOXAPARIN SODIUM 40 MG/0.4ML IJ SOSY
40.0000 mg | PREFILLED_SYRINGE | INTRAMUSCULAR | Status: DC
  Administered 2021-02-23 – 2021-02-25 (×3): 40 mg via SUBCUTANEOUS
  Filled 2021-02-23 (×3): qty 0.4

## 2021-02-23 MED ORDER — SODIUM CHLORIDE 0.9% FLUSH
3.0000 mL | Freq: Two times a day (BID) | INTRAVENOUS | Status: DC
  Administered 2021-02-23 – 2021-02-26 (×6): 3 mL via INTRAVENOUS

## 2021-02-23 MED ORDER — ALBUTEROL SULFATE (2.5 MG/3ML) 0.083% IN NEBU: 3.0000 mL | INHALATION_SOLUTION | RESPIRATORY_TRACT | Status: DC | PRN

## 2021-02-23 MED ORDER — ACETAMINOPHEN 325 MG PO TABS
650.0000 mg | ORAL_TABLET | Freq: Four times a day (QID) | ORAL | Status: DC | PRN
  Administered 2021-02-23: 650 mg via ORAL
  Filled 2021-02-23: qty 2

## 2021-02-23 MED ORDER — IOHEXOL 350 MG/ML SOLN
75.0000 mL | Freq: Once | INTRAVENOUS | Status: AC | PRN
  Administered 2021-02-23: 75 mL via INTRAVENOUS

## 2021-02-23 MED ORDER — INSULIN ASPART 100 UNIT/ML IJ SOLN
0.0000 [IU] | Freq: Three times a day (TID) | INTRAMUSCULAR | Status: DC
  Administered 2021-02-24: 8 [IU] via SUBCUTANEOUS
  Administered 2021-02-24: 2 [IU] via SUBCUTANEOUS
  Administered 2021-02-25 (×2): 3 [IU] via SUBCUTANEOUS
  Administered 2021-02-25: 5 [IU] via SUBCUTANEOUS
  Administered 2021-02-26: 2 [IU] via SUBCUTANEOUS
  Filled 2021-02-23: qty 0.15

## 2021-02-23 MED ORDER — MAGNESIUM SULFATE 2 GM/50ML IV SOLN
2.0000 g | Freq: Once | INTRAVENOUS | Status: AC
  Administered 2021-02-23: 2 g via INTRAVENOUS
  Filled 2021-02-23: qty 50

## 2021-02-23 MED ORDER — ROSUVASTATIN CALCIUM 20 MG PO TABS
20.0000 mg | ORAL_TABLET | Freq: Every day | ORAL | Status: DC
  Administered 2021-02-23 – 2021-02-25 (×3): 20 mg via ORAL
  Filled 2021-02-23 (×4): qty 1

## 2021-02-23 MED ORDER — ACETAMINOPHEN 650 MG RE SUPP: 650.0000 mg | Freq: Four times a day (QID) | RECTAL | Status: DC | PRN

## 2021-02-23 MED ORDER — ONDANSETRON HCL 4 MG PO TABS: 4.0000 mg | ORAL_TABLET | Freq: Four times a day (QID) | ORAL | Status: DC | PRN

## 2021-02-23 MED ORDER — LOSARTAN POTASSIUM 50 MG PO TABS
100.0000 mg | ORAL_TABLET | Freq: Every day | ORAL | Status: DC
  Administered 2021-02-23 – 2021-02-26 (×4): 100 mg via ORAL
  Filled 2021-02-23 (×2): qty 2
  Filled 2021-02-23: qty 4
  Filled 2021-02-23: qty 2

## 2021-02-23 MED ORDER — SODIUM CHLORIDE 0.9 % IV BOLUS
1000.0000 mL | Freq: Once | INTRAVENOUS | Status: AC
  Administered 2021-02-23: 1000 mL via INTRAVENOUS

## 2021-02-23 MED ORDER — ONDANSETRON HCL 4 MG/2ML IJ SOLN: 4.0000 mg | Freq: Four times a day (QID) | INTRAMUSCULAR | Status: DC | PRN

## 2021-02-23 NOTE — ED Notes (Signed)
Phlebotomy called for lab draw 

## 2021-02-23 NOTE — ED Notes (Signed)
IV team at bedside 

## 2021-02-23 NOTE — ED Notes (Signed)
Pt given dinner tray.

## 2021-02-23 NOTE — ED Triage Notes (Signed)
Pt reports syncopal episode that occurred this morning upon waking. Pt was extremely diaphoretic per wife. Pt reports fatigue and SHOB for a few months now, but this is his first syncopal episode.

## 2021-02-23 NOTE — ED Provider Notes (Signed)
[facility]-EMERGENCY DEPT Provider Note   CSN: [id number] Arrival date & time: 02/23/21  [id number]     History Chief Complaint  Patient presents with   Loss of Consciousness    Andrew Parker is a 65 y.o. male history includes hypertension, hyperlipidemia, sarcoidosis, type 2 diabetes, CAD.  Patient presents today after syncopal episode.  Patient was walking around this morning getting ready for work, he does not recall any preceding symptoms, woke up on the floor diaphoretic with his wife standing over him.  EMS brought patient to the ER for further evaluation.  Patient reports he has been feeling more fatigued recently and has been experiencing shortness of breath for the past few weeks shortness of breath is worse with exertion.  Denies head injury, neck pain, back pain, chest pain, cough/hemoptysis, abdominal pain, nausea/vomiting, diarrhea, extremity pain/swelling or any additional concerns today.  HPI     Past Medical History:  Diagnosis Date   Allergic rhinitis    Asthma    Bronchospasm    Dermatitis, atopic    Eczema    ED (erectile dysfunction)    Hypertension    Hypogonadism in male    Mixed hyperlipidemia    Paresthesia    Sarcoidosis    TMJ syndrome    Type 2 diabetes mellitus with hyperglycemia [facility](HCC)     Patient Active Problem List   Diagnosis Date Noted   Hypertension    Mixed hyperlipidemia    Type 2 diabetes mellitus with hyperglycemia (HCC)    Syncope    COVID-19 virus infection    Normocytic anemia     History reviewed. No pertinent surgical history.     Family History  Problem Relation Age of Onset   Hypertension Mother    Diabetes Mother    High Cholesterol Mother    Lupus Mother    Stroke Sister    Hypertension Sister    Hypercholesterolemia Sister    Heart attack Sister    Stroke Paternal Grandfather     Social History   Tobacco Use   Smoking status: Former    Packs/day: 1.00    Years: 6.00    Pack years:  6.00    Types: Cigarettes    Quit date: 12/18/1980    Years since quitting: 40.2   Smokeless tobacco: Never  Substance Use Topics   Alcohol use: Never   Drug use: Never    Home Medications Prior to Admission medications   Medication Sig Start Date End Date Taking? Authorizing Provider  albuterol (VENTOLIN HFA) 108 (90 Base) MCG/ACT inhaler Inhale 2 puffs into the lungs every 4 (four) hours as needed for wheezing or shortness of breath. 01/26/21  Yes [provider]  losartan-hydrochlorothiazide (HYZAAR) 100-12.5 MG tablet Take 0.5 tablets by mouth daily. 02/25/19  Yes [provider]  metformin (FORTAMET) 1000 MG (OSM) 24 hr tablet Take 2,000 mg by mouth in the morning and at bedtime.   Yes [provider]  rosuvastatin (CRESTOR) 20 MG tablet Take 1 tablet (20 mg total) by mouth daily. Patient taking differently: Take 20 mg by mouth every evening. 01/16/21  Yes Corky CraftsVaranasi, Jayadeep S, MD  sildenafil (REVATIO) 20 MG tablet Take 40-100 mg by mouth daily as needed (erectile dysfunction). 11/18/20  Yes [provider]  metoprolol tartrate (LOPRESSOR) 100 MG tablet Take one tablet by mouth 2 hours prior to CT Scan Patient not taking: No sig reported 01/03/21   Corky CraftsVaranasi, Jayadeep S, MD    Allergies  Patient has no known allergies.  Review of Systems   Review of Systems Ten systems are reviewed and are negative for acute change except as noted in the HPI  Physical Exam Updated Vital Signs BP (!) 145/85   Pulse 81   Temp 100 F (37.8 C)   Resp (!) 23   Ht 5\' 9"  (1.753 m)   Wt 88.5 kg   SpO2 100%   BMI 28.80 kg/m   Physical Exam Constitutional:      General: He is not in acute distress.    Appearance: Normal appearance. He is well-developed. He is not ill-appearing or diaphoretic.  HENT:     Head: Normocephalic and atraumatic.  Eyes:     General: Vision grossly intact. Gaze aligned appropriately.     Pupils: Pupils are equal, round, and reactive  to light.  Neck:     Trachea: Trachea and phonation normal.  Cardiovascular:     Rate and Rhythm: Normal rate and regular rhythm.     Pulses: Normal pulses.  Pulmonary:     Effort: Pulmonary effort is normal. No respiratory distress.     Breath sounds: Normal breath sounds.  Abdominal:     General: There is no distension.     Palpations: Abdomen is soft.     Tenderness: There is no abdominal tenderness. There is no guarding or rebound.  Musculoskeletal:        General: Normal range of motion.     Cervical back: Normal range of motion and neck supple. No spinous process tenderness or muscular tenderness.     Right lower leg: No edema.     Left lower leg: No edema.  Skin:    General: Skin is warm and dry.  Neurological:     Mental Status: He is alert.     GCS: GCS eye subscore is 4. GCS verbal subscore is 5. GCS motor subscore is 6.     Comments: Speech is clear and goal oriented, follows commands Major Cranial nerves without deficit, no facial droop Moves extremities without ataxia, coordination intact  Psychiatric:        Behavior: Behavior normal.    ED Results / Procedures / Treatments   Labs (all labs ordered are listed, but only abnormal results are displayed) Labs Reviewed  RESP PANEL BY RT-PCR (FLU A&B, COVID) ARPGX2 - Abnormal; Notable for the following components:      Result Value   SARS Coronavirus 2 by RT PCR POSITIVE (*)    All other components within normal limits  BASIC METABOLIC PANEL - Abnormal; Notable for the following components:   Sodium 132 (*)    Glucose, Bld 245 (*)    Calcium 8.6 (*)    All other components within normal limits  CBC - Abnormal; Notable for the following components:   RBC 3.57 (*)    Hemoglobin 10.5 (*)    HCT 28.8 (*)    MCHC 36.5 (*)    RDW 11.3 (*)    All other components within normal limits  CBG MONITORING, ED - Abnormal; Notable for the following components:   Glucose-Capillary 251 (*)    All other components within  normal limits  URINALYSIS, ROUTINE W REFLEX MICROSCOPIC  HIV ANTIBODY (ROUTINE TESTING W REFLEX)  CBC  CREATININE, SERUM  MAGNESIUM  PHOSPHORUS  TSH  URINALYSIS, ROUTINE W REFLEX MICROSCOPIC  HEMOGLOBIN A1C  BRAIN NATRIURETIC PEPTIDE  C-REACTIVE PROTEIN  D-DIMER, QUANTITATIVE  FERRITIN  FIBRINOGEN  PROCALCITONIN  TROPONIN I (HIGH SENSITIVITY)  TROPONIN I (HIGH SENSITIVITY)    EKG EKG Interpretation  Date/Time:  Friday February 23 2021 08:39:13 EDT Ventricular Rate:  96 PR Interval:  135 QRS Duration: 95 QT Interval:  331 QTC Calculation: 419 R Axis:   59 Text Interpretation: Sinus rhythm Minimal ST elevation, anterior leads No significant change since last tracing Confirmed by Melene Plan [phone]) on 02/23/2021 8:51:00 AM  Radiology CT Angio Chest PE W and/or Wo Contrast  Result Date: 02/23/2021 CLINICAL DATA:  65 year old male with syncopal episode and shortness of breath, suspected pulmonary embolism EXAM: CT ANGIOGRAPHY CHEST WITH CONTRAST TECHNIQUE: Multidetector CT imaging of the chest was performed using the standard protocol during bolus administration of intravenous contrast. Multiplanar CT image reconstructions and MIPs were obtained to evaluate the vascular anatomy. CONTRAST:  Seventy-five mL Omnipaque 350, intravenous COMPARISON:  None. FINDINGS: Cardiovascular: Satisfactory opacification of the pulmonary arteries to the segmental level. No evidence of pulmonary embolism. Normal heart size. No pericardial effusion. Mediastinum/Nodes: No enlarged mediastinal, hilar, or axillary lymph nodes. Thyroid gland, trachea, and esophagus demonstrate no significant findings. Lungs/Pleura: Bibasilar subsegmental atelectasis. No focal consolidations. No pleural effusion or pneumothorax. There trace layering secretions within the distal trachea. Upper Abdomen: The visualized upper abdomen is within normal limits. Musculoskeletal: No chest wall abnormality. No acute or significant osseous  findings. Review of the MIP images confirms the above findings. IMPRESSION: Vascular: No evidence of pulmonary embolism. Non-Vascular: Bibasilar subsegmental atelectasis. Marliss Coots, MD [facility] Radiology Electronically Signed   By: Marliss Coots M.D.   On: 02/23/2021 13:26   DG Chest Portable 1 View  Result Date: 02/23/2021 CLINICAL DATA:  65 year old male with history of shortness of breath and syncope. EXAM: PORTABLE CHEST 1 VIEW COMPARISON:  Chest x-ray 05/25/2018. FINDINGS: Lung volumes are normal. No consolidative airspace disease. No pleural effusions. No pneumothorax. No pulmonary nodule or mass noted. Pulmonary vasculature and the cardiomediastinal silhouette are within normal limits. IMPRESSION: No radiographic evidence of acute cardiopulmonary disease. Electronically Signed   By: Trudie Reed M.D.   On: 02/23/2021 09:29    Procedures Procedures   Medications Ordered in ED Medications  sodium chloride flush (NS) 0.9 % injection 3 mL (has no administration in time range)  enoxaparin (LOVENOX) injection 40 mg (has no administration in time range)  acetaminophen (TYLENOL) tablet 650 mg (has no administration in time range)    Or  acetaminophen (TYLENOL) suppository 650 mg (has no administration in time range)  ondansetron (ZOFRAN) tablet 4 mg (has no administration in time range)    Or  ondansetron (ZOFRAN) injection 4 mg (has no administration in time range)  rosuvastatin (CRESTOR) tablet 20 mg (has no administration in time range)  albuterol (PROVENTIL) (2.5 MG/3ML) 0.083% nebulizer solution 3 mL (has no administration in time range)  insulin aspart (novoLOG) injection 0-15 Units (has no administration in time range)  insulin aspart (novoLOG) injection 0-5 Units (has no administration in time range)  sodium chloride 0.9 % bolus 1,000 mL (0 mLs Intravenous Stopped 02/23/21 1151)  iohexol (OMNIPAQUE) 350 MG/ML injection 75 mL (75  mLs Intravenous Contrast Given 02/23/21 1243)    ED Course  I have reviewed the triage vital signs and the nursing notes.  Pertinent labs & imaging results that were available during my care of the patient were reviewed by me and considered in my medical decision making (see chart for details).  Clinical Course as of 02/23/21 1445  Fri Feb 23, 2021  1408 Dr. Jacqulyn Bath [BM]  Clinical Course User Index [BM] Elizabeth Palau   MDM Rules/Calculators/A&P                           Additional history obtained from: Nursing notes from this visit. Family, wife at bedside Review of electronic medical records patient CT coronary scan on 01/11/2021.  Calcium score was 220, plaque noted on L CX, RCA and LAD. ---- I ordered, reviewed and interpreted labs which include: CBG 251 COVID positive BMP shows no emergent electrolyte derangement, AKI or gap.  Initial troponin within normal limits CBC shows hemoglobin 10.5, no recent hemoglobin to compare.  No thrombocytopenia or anemia  CXR:   IMPRESSION:  No radiographic evidence of acute cardiopulmonary disease.   CTA Chest PE Study:  IMPRESSION:  Vascular:     No evidence of pulmonary embolism.     Non-Vascular:     Bibasilar subsegmental atelectasis.   EKG: Sinus rhythm Minimal ST elevation, anterior leads No significant change since last tracing Confirmed by Melene Plan [phone]) on 02/23/2021 8:51:00 AM - Patient reassessed resting company no acute distress no recurrence of syncope.  Vital signs stable.  Patient seen and evaluated by Dr. Adela Lank during this visit.  Shared decision made with patient, given cardiac risk factors syncope and exertional shortness of breath decision was made to admit this patient for further cardiac work-up.  Unclear if COVID is a contributing factor today since his shortness of breath has been ongoing for the past several weeks.  Patient without respiratory symptoms suggestive of COVID at this  time  Consult called to hospitalist service, spoke with Dr. Jacqulyn Bath, patient was admitted to medicine team for further management.  Note: Portions of this report may have been transcribed using voice recognition software. Every effort was made to ensure accuracy; however, inadvertent computerized transcription errors may still be present.  Final Clinical Impression(s) / ED Diagnoses Final diagnoses:  Syncope and collapse  COVID-19    Rx / DC Orders ED Discharge Orders     None        Elizabeth Palau 02/23/21 1445    Melene Plan, DO 02/23/21 1535

## 2021-02-23 NOTE — ED Notes (Signed)
Unsuccessful attempt x 1 for an IV for CT angio. Another nurse will assist.

## 2021-02-23 NOTE — H&P (Signed)
History and Physical    Andrew Parker XBM:[id number] DOB: Aug 17, 1955 DOA: 02/23/2021  PCP: Tally Joe, MD  Patient coming from: Home  I have personally briefly reviewed patient's old medical records in [facility]  Chief Complaint: Syncope   HPI: Andrew Parker is a 65 y.o. male with medical history significant of hypertension, hyperlipidemia, type 2 diabetes, erectile dysfunction, asthma/allergic rhinitis presents here for evaluation of syncope.  Patient's wife reports that while patient was in the restroom this morning he fell on the ground however no head trauma, loss of consciousness, seizure-like activity reported.  He tells me that he felt weak and dizzy before the syncopal episode.  He does not remember anything about the episode.    No chest pain, fever, chills, nausea, vomiting, diarrhea, melena, hematemesis, epigastric pain, urinary problems including dysuria, hematuria, decreased appetite.  He has chronic exertional shortness of breath which has been going on for more than 2 months. has some dry cough.  Wife reports that patient started feeling bad yesterday, he feels weak and fatigue.  he had CT coronary artery recently which shows calcium score of 220, 83rd percentile for age, race, sex.  Mild 25 to 49% plaque in the left circumflex and minimal plaque less than 25% in the RCA and LAD.  Recommend aggressive risk factor modification.  He is fully vaccinated against COVID-19 and booster x1.  No smoking, alcohol, licit drug use.  Lives with his wife at home.  Does not use walker or cane.  No previous history of syncope.  ED Course: Upon arrival to ED: Patient tachypneic, respiratory rate in 20s, afebrile, no leukocytosis, maintaining oxygen saturation on room air, COVID-19 positive, BMP shows sodium of 132, H&H 10.5/28.8, troponin x2 negative, chest x-ray negative for pneumonia, CTA chest negative for PE.  Patient was given IV fluid bolus in ED.  [facility] hospitalist consulted for  admission for syncopal evaluation and COVID-positive.  Review of Systems: As per HPI otherwise negative.    Past Medical History:  Diagnosis Date   Allergic rhinitis    Asthma    Bronchospasm    Dermatitis, atopic    Eczema    ED (erectile dysfunction)    Hypertension    Hypogonadism in male    Mixed hyperlipidemia    Paresthesia    Sarcoidosis    TMJ syndrome    Type 2 diabetes mellitus with hyperglycemia (HCC)     History reviewed. No pertinent surgical history.   reports that he quit smoking about 40 years ago. His smoking use included cigarettes. He has a 6.00 pack-year smoking history. He has never used smokeless tobacco. He reports that he does not drink alcohol and does not use drugs.  No Known Allergies  Family History  Problem Relation Age of Onset   Hypertension Mother    Diabetes Mother    High Cholesterol Mother    Lupus Mother    Stroke Sister    Hypertension Sister    Hypercholesterolemia Sister    Heart attack Sister    Stroke Paternal Grandfather     Prior to Admission medications   Medication Sig Start Date End Date Taking? Authorizing Provider  albuterol (VENTOLIN HFA) 108 (90 Base) MCG/ACT inhaler Inhale 2 puffs into the lungs every 4 (four) hours as needed for wheezing or shortness of breath. 01/26/21  Yes [provider]  losartan-hydrochlorothiazide (HYZAAR) 100-12.5 MG tablet Take 0.5 tablets by mouth daily. 02/25/19  Yes [provider]  metformin (FORTAMET) 1000 MG (OSM) 24  hr tablet Take 2,000 mg by mouth in the morning and at bedtime.   Yes [provider]  rosuvastatin (CRESTOR) 20 MG tablet Take 1 tablet (20 mg total) by mouth daily. Patient taking differently: Take 20 mg by mouth every evening. 01/16/21  Yes Corky Crafts, MD  sildenafil (REVATIO) 20 MG tablet Take 40-100 mg by mouth daily as needed (erectile dysfunction). 11/18/20  Yes [provider]  metoprolol tartrate (LOPRESSOR) 100 MG tablet  Take one tablet by mouth 2 hours prior to CT Scan Patient not taking: No sig reported 01/03/21   Corky Crafts, MD    Physical Exam: Vitals:   02/23/21 0945 02/23/21 1030 02/23/21 1130 02/23/21 1230  BP: 133/78 138/79 130/80 (!) 145/85  Pulse: 86 77 87 81  Resp: (!) 26 (!) 22 (!) 26 (!) 23  Temp:      SpO2: 100% 100% 100% 100%  Weight:      Height:        Constitutional: NAD, calm, comfortable, on room air, communicating well Eyes: PERRL, lids and conjunctivae normal ENMT: Mucous membranes are moist. Posterior pharynx clear of any exudate or lesions.Normal dentition.  Neck: normal, supple, no masses, no thyromegaly Respiratory: clear to auscultation bilaterally, no wheezing, no crackles. Normal respiratory effort. No accessory muscle use.  Cardiovascular: Regular rate and rhythm, no murmurs / rubs / gallops. No extremity edema. 2+ pedal pulses. No carotid bruits.  Abdomen: no tenderness, no masses palpated. No hepatosplenomegaly. Bowel sounds positive.  Musculoskeletal: no clubbing / cyanosis. No joint deformity upper and lower extremities. Good ROM, no contractures. Normal muscle tone.  Skin: no rashes, lesions, ulcers. No induration Neurologic: CN 2-12 grossly intact. Sensation intact, DTR normal. Strength 5/5 in all 4.  Psychiatric: Normal judgment and insight. Alert and oriented x 3. Normal mood.    Labs on Admission: I have personally reviewed following labs and imaging studies  CBC: Recent Labs  Lab 02/23/21 0900  WBC 7.5  HGB 10.5*  HCT 28.8*  MCV 80.7  PLT 186   Basic Metabolic Panel: Recent Labs  Lab 02/23/21 0900  NA 132*  K 3.9  CL 99  CO2 25  GLUCOSE 245*  BUN 18  CREATININE 1.15  CALCIUM 8.6*   GFR: Estimated Creatinine Clearance: 69.5 mL/min (by C-G formula based on SCr of 1.15 mg/dL). Liver Function Tests: No results for input(s): AST, ALT, ALKPHOS, BILITOT, PROT, ALBUMIN in the last 168 hours. No results for input(s): LIPASE, AMYLASE in  the last 168 hours. No results for input(s): AMMONIA in the last 168 hours. Coagulation Profile: No results for input(s): INR, PROTIME in the last 168 hours. Cardiac Enzymes: No results for input(s): CKTOTAL, CKMB, CKMBINDEX, TROPONINI in the last 168 hours. BNP (last 3 results) No results for input(s): PROBNP in the last 8760 hours. HbA1C: No results for input(s): HGBA1C in the last 72 hours. CBG: Recent Labs  Lab 02/23/21 0836  GLUCAP 251*   Lipid Profile: No results for input(s): CHOL, HDL, LDLCALC, TRIG, CHOLHDL, LDLDIRECT in the last 72 hours. Thyroid Function Tests: No results for input(s): TSH, T4TOTAL, FREET4, T3FREE, THYROIDAB in the last 72 hours. Anemia Panel: No results for input(s): VITAMINB12, FOLATE, FERRITIN, TIBC, IRON, RETICCTPCT in the last 72 hours. Urine analysis:    Component Value Date/Time   COLORURINE YELLOW 07/01/2008 1857   APPEARANCEUR CLEAR 07/01/2008 1857   LABSPEC 1.033 (H) 07/01/2008 1857   PHURINE 6.0 07/01/2008 1857   GLUCOSEU NEGATIVE 07/01/2008 1857   HGBUR  NEGATIVE 07/01/2008 1857   BILIRUBINUR NEGATIVE 07/01/2008 1857   KETONESUR 15 (A) 07/01/2008 1857   PROTEINUR 30 (A) 07/01/2008 1857   UROBILINOGEN 2.0 (H) 07/01/2008 1857   NITRITE NEGATIVE 07/01/2008 1857   LEUKOCYTESUR NEGATIVE 07/01/2008 1857    Radiological Exams on Admission: CT Angio Chest PE W and/or Wo Contrast  Result Date: 02/23/2021 CLINICAL DATA:  65 year old male with syncopal episode and shortness of breath, suspected pulmonary embolism EXAM: CT ANGIOGRAPHY CHEST WITH CONTRAST TECHNIQUE: Multidetector CT imaging of the chest was performed using the standard protocol during bolus administration of intravenous contrast. Multiplanar CT image reconstructions and MIPs were obtained to evaluate the vascular anatomy. CONTRAST:  Seventy-five mL Omnipaque 350, intravenous COMPARISON:  None. FINDINGS: Cardiovascular: Satisfactory opacification of the pulmonary arteries to the  segmental level. No evidence of pulmonary embolism. Normal heart size. No pericardial effusion. Mediastinum/Nodes: No enlarged mediastinal, hilar, or axillary lymph nodes. Thyroid gland, trachea, and esophagus demonstrate no significant findings. Lungs/Pleura: Bibasilar subsegmental atelectasis. No focal consolidations. No pleural effusion or pneumothorax. There trace layering secretions within the distal trachea. Upper Abdomen: The visualized upper abdomen is within normal limits. Musculoskeletal: No chest wall abnormality. No acute or significant osseous findings. Review of the MIP images confirms the above findings. IMPRESSION: Vascular: No evidence of pulmonary embolism. Non-Vascular: Bibasilar subsegmental atelectasis. Marliss Cootsylan Suttle, MD [facility] Electronically Signed   By: Marliss Cootsylan  Suttle M.D.   On: 02/23/2021 13:26   DG Chest Portable 1 View  Result Date: 02/23/2021 CLINICAL DATA:  65 year old male with history of shortness of breath and syncope. EXAM: PORTABLE CHEST 1 VIEW COMPARISON:  Chest x-ray 05/25/2018. FINDINGS: Lung volumes are normal. No consolidative airspace disease. No pleural effusions. No pneumothorax. No pulmonary nodule or mass noted. Pulmonary vasculature and the cardiomediastinal silhouette are within normal limits. IMPRESSION: No radiographic evidence of acute cardiopulmonary disease. Electronically Signed   By: Trudie Reedaniel  Entrikin M.D.   On: 02/23/2021 09:29    EKG: Independently reviewed.  Normal sinus rhythm, no acute ST-T wave changes noted.  Assessment/Plan Principal Problem:   Syncope Active Problems:   Hypertension   Mixed hyperlipidemia   Type 2 diabetes mellitus with hyperglycemia (HCC)   COVID-19 virus infection   Normocytic anemia    Syncope: -Unknown etiology.  Could be cardiac?  Versus orthostatic hypotension?  No focal neurological deficit noted on exam.  No previous history of seizures.  Chest x-ray  negative for pneumonia.  CTA chest negative for PE.   -Troponin x2 negative.  Received IV fluid bolus in ED. -Admit patient on the floor.  On telemetry -Check orthostatic vitals, get transthoracic echo -Check UA, TSH -Consult PT/OT -Fall/seizure precautions -May need Holter monitor at the time of discharge  COVID-19 infection: -Incidental?.  He has chronic shortness of breath for more than 2 months.  Seen by cardiology in July 2022.  Mild cough.  Maintaining oxygen saturation on room air.  He is fully vaccinated against COVID. -Check inflammatory markers.  Chest x-ray and CTA chest negative for acute findings -Hold off treatment at this time  Hypertension: Stable -Hold HCTZ for now due to hyponatremia -We will continue losartan.  Monitor blood pressure closely  Hyponatremia: Sodium 132 -Likely in the setting of HCTZ.  Hold HCTZ and monitor sodium level.  Type 2 diabetes mellitus: Hold metformin.  Check A1c.  Start patient on sliding scale insulin monitor blood sugar closely  Hyperlipidemia: Continue statin  Normocytic anemia: -Denies melena or hematemesis or epigastric burning -  Monitor H&H closely.  Check iron studies  Chronic exertional shortness of breath: Abnormal CT coronary artery: -Troponin x2 negative.  EKG: No acute changes. -Followed by cardiology Dr. Eldridge Dace -Reviewed recent CT coronary artery -Patient was recommended strict lifestyle modification  Hypomagnesemia: Replenished.  Repeat magnesium level tomorrow AM.  DVT prophylaxis: Lovenox/SCD Code Status: Full code Family Communication: Patient's wife present at bedside.  Plan of care discussed with patient in length and he verbalized understanding and agreed with it. Disposition Plan: Likely home in 1 to 2 days Consults called: None Admission status: Inpatient   Ollen Bowl MD [facility]  If 7PM-7AM, please contact night-coverage [email or web address]  02/23/2021, 2:09 PM

## 2021-02-24 ENCOUNTER — Inpatient Hospital Stay (HOSPITAL_COMMUNITY): Payer: Medicare Other

## 2021-02-24 DIAGNOSIS — R55 Syncope and collapse: Secondary | ICD-10-CM | POA: Diagnosis not present

## 2021-02-24 DIAGNOSIS — U071 COVID-19: Secondary | ICD-10-CM | POA: Diagnosis not present

## 2021-02-24 DIAGNOSIS — E1165 Type 2 diabetes mellitus with hyperglycemia: Secondary | ICD-10-CM

## 2021-02-24 DIAGNOSIS — I10 Essential (primary) hypertension: Secondary | ICD-10-CM | POA: Diagnosis not present

## 2021-02-24 LAB — ECHOCARDIOGRAM LIMITED
Area-P 1/2: 3.65 cm2
Calc EF: 58.2 %
Height: 69 in
S' Lateral: 2.2 cm
Single Plane A2C EF: 61.7 %
Single Plane A4C EF: 57.9 %
Weight: 3231.06 oz

## 2021-02-24 LAB — CBC
HCT: 27.4 % — ABNORMAL LOW (ref 39.0–52.0)
Hemoglobin: 9.9 g/dL — ABNORMAL LOW (ref 13.0–17.0)
MCH: 28.8 pg (ref 26.0–34.0)
MCHC: 36.1 g/dL — ABNORMAL HIGH (ref 30.0–36.0)
MCV: 79.7 fL — ABNORMAL LOW (ref 80.0–100.0)
Platelets: 190 10*3/uL (ref 150–400)
RBC: 3.44 MIL/uL — ABNORMAL LOW (ref 4.22–5.81)
RDW: 11.5 % (ref 11.5–15.5)
WBC: 4 10*3/uL (ref 4.0–10.5)
nRBC: 0 % (ref 0.0–0.2)

## 2021-02-24 LAB — HEMOGLOBIN A1C
Hgb A1c MFr Bld: 10.3 % — ABNORMAL HIGH (ref 4.8–5.6)
Mean Plasma Glucose: 248.91 mg/dL

## 2021-02-24 LAB — GLUCOSE, CAPILLARY
Glucose-Capillary: 155 mg/dL — ABNORMAL HIGH (ref 70–99)
Glucose-Capillary: 140 mg/dL — ABNORMAL HIGH (ref 70–99)
Glucose-Capillary: 279 mg/dL — ABNORMAL HIGH (ref 70–99)
Glucose-Capillary: 75 mg/dL (ref 70–99)
Glucose-Capillary: 236 mg/dL — ABNORMAL HIGH (ref 70–99)

## 2021-02-24 LAB — BASIC METABOLIC PANEL
Anion gap: 8 (ref 5–15)
BUN: 15 mg/dL (ref 8–23)
CO2: 27 mmol/L (ref 22–32)
Calcium: 8.5 mg/dL — ABNORMAL LOW (ref 8.9–10.3)
Chloride: 102 mmol/L (ref 98–111)
Creatinine, Ser: 1.24 mg/dL (ref 0.61–1.24)
GFR, Estimated: >60 mL/min (ref >60)
Glucose, Bld: 140 mg/dL — ABNORMAL HIGH (ref 70–99)
Potassium: 3.5 mmol/L (ref 3.5–5.1)
Sodium: 137 mmol/L (ref 135–145)

## 2021-02-24 LAB — MAGNESIUM: Magnesium: 2.2 mg/dL (ref 1.7–2.4)

## 2021-02-24 NOTE — Progress Notes (Signed)
  Echocardiogram 2D Echocardiogram has been performed.  Andrew Parker 02/24/2021, 8:42 AM

## 2021-02-24 NOTE — Evaluation (Addendum)
Physical Therapy Evaluation-1x Patient Details Name: Andrew Parker MRN: [id number] DOB: [birth year] Today's Date: 02/24/2021   History of Present Illness  65 yo male admitted with syncope, COVID. Hx of sarcoidosis, DM, CAD  Clinical Impression  On eval, pt was Ind with mobility. Pt denied dizziness. No acute therapy needs. 1x eval. Will sign off.     Follow Up Recommendations No PT follow up    Equipment Recommendations  None recommended by PT    Recommendations for Other Services       Precautions / Restrictions Restrictions Weight Bearing Restrictions: No      Mobility  Bed Mobility Overal bed mobility: Independent                  Transfers Overall transfer level: Independent                  Ambulation/Gait Ambulation/Gait assistance: Independent Gait Distance (Feet): 50 Feet         General Gait Details: walked independently around room. pt denied dizziness  Stairs            Wheelchair Mobility    Modified Rankin (Stroke Patients Only)       Balance Overall balance assessment: Independent                                           Pertinent Vitals/Pain Pain Assessment: No/denies pain    Home Living Family/patient expects to be discharged to:: Private residence Living Arrangements: Spouse/significant other Available Help at Discharge: Family Type of Home: House Home Access: Stairs to enter Entrance Stairs-Rails: None Secretary/administrator of Steps: 2 Home Layout: One level;Bed/bath upstairs Home Equipment: None      Prior Function Level of Independence: Independent               Hand Dominance        Extremity/Trunk Assessment   Upper Extremity Assessment Upper Extremity Assessment: Overall WFL for tasks assessed    Lower Extremity Assessment Lower Extremity Assessment: Overall WFL for tasks assessed    Cervical / Trunk Assessment Cervical / Trunk Assessment: Normal  Communication    Communication: No difficulties  Cognition Arousal/Alertness: Awake/alert Behavior During Therapy: WFL for tasks assessed/performed Overall Cognitive Status: Within Functional Limits for tasks assessed                                        General Comments      Exercises     Assessment/Plan    PT Assessment Patent does not need any further PT services  PT Problem List         PT Treatment Interventions      PT Goals (Current goals can be found in the Care Plan section)  Acute Rehab PT Goals Patient Stated Goal: home soon PT Goal Formulation: All assessment and education complete, DC therapy    Frequency     Barriers to discharge        Co-evaluation               AM-PAC PT "6 Clicks" Mobility  Outcome Measure Help needed turning from your back to your side while in a flat bed without using bedrails?: None Help needed moving from lying on your back to sitting on the side of  a flat bed without using bedrails?: None Help needed moving to and from a bed to a chair (including a wheelchair)?: None Help needed standing up from a chair using your arms (e.g., wheelchair or bedside chair)?: None Help needed to walk in hospital room?: None Help needed climbing 3-5 steps with a railing? : None 6 Click Score: 24    End of Session   Activity Tolerance: Patient tolerated treatment well Patient left: in bed;with family/visitor present;with call bell/phone within reach        Time: 8657-8469 PT Time Calculation (min) (ACUTE ONLY): 11 min   Charges:   PT Evaluation $PT Eval Low Complexity: 1 Low        Faye Ramsay, PT Acute Rehabilitation  Office: [phone] Pager: [phone]

## 2021-02-25 DIAGNOSIS — E1165 Type 2 diabetes mellitus with hyperglycemia: Secondary | ICD-10-CM | POA: Diagnosis not present

## 2021-02-25 DIAGNOSIS — U071 COVID-19: Secondary | ICD-10-CM | POA: Diagnosis not present

## 2021-02-25 DIAGNOSIS — I10 Essential (primary) hypertension: Secondary | ICD-10-CM | POA: Diagnosis not present

## 2021-02-25 LAB — GLUCOSE, CAPILLARY
Glucose-Capillary: 158 mg/dL — ABNORMAL HIGH (ref 70–99)
Glucose-Capillary: 152 mg/dL — ABNORMAL HIGH (ref 70–99)
Glucose-Capillary: 225 mg/dL — ABNORMAL HIGH (ref 70–99)
Glucose-Capillary: 198 mg/dL — ABNORMAL HIGH (ref 70–99)
Glucose-Capillary: 226 mg/dL — ABNORMAL HIGH (ref 70–99)

## 2021-02-25 MED ORDER — INSULIN GLARGINE-YFGN 100 UNIT/ML ~~LOC~~ SOLN
5.0000 [IU] | Freq: Every day | SUBCUTANEOUS | Status: DC
  Administered 2021-02-25: 5 [IU] via SUBCUTANEOUS
  Filled 2021-02-25: qty 0.05

## 2021-02-25 NOTE — Care Management CC44 (Signed)
Condition Code 44 Documentation Completed  Patient Details  Name: Quill Grinder MRN: [id number] Date of Birth: 06-08-1956   Condition Code 44 given:  Yes Patient signature on Condition Code 44 notice:  Yes (Verbal signature/covid positive) Documentation of 2 MD's agreement:  Yes Code 44 added to claim:  Yes Explained over phone. Copy left for RN to take in room.   Bartholome Bill, RN 02/25/2021, 9:57 AM

## 2021-02-25 NOTE — Progress Notes (Addendum)
[facility]  PROGRESS NOTE  Andrew AhmadiRobert Parker ZOX:[id number] DOB: 10-Dec-1955 DOA: 02/23/2021 PCP: Andrew JoeSwayne, David, MD   Brief HPI:   65 year old male with medical history of hypertension, hyperlipidemia, diabetes mellitus type 2, asthma/allergic rhinitis presented for evaluation of syncope patient was in restroom this morning patient said that he felt dizzy before syncopal episode.  No chest pain shortness of breath. he had CT coronary artery recently which shows calcium score of 220, 83rd percentile for age, race, sex.  Mild 25 to 49% plaque in the left circumflex and minimal plaque less than 25% in the RCA and LAD.  Recommend aggressive risk factor modification   Subjective   Orthostatic vital signs were negative for orthostatic hypotension.   Assessment/Plan:     Syncope -Unclear etiology; orthostatics normal -Continue monitoring on telemetry -Echocardiogram shows EF of 60 to 65%; mild LVH, grade 1 diastolic dysfunction -We will obtain EEG to rule out seizure   COVID-19 infection -Incidental finding -He has chronic shortness of breath for multiple months -CT chest negative for acute findings -No treatment at this time  Hyponatremia -Sodium was 132, likely due to HCTZ -Improved to 137 today  Hypertension -Blood pressure stable -HCTZ on hold -Continue losartan;  -may need to discontinue HCTZ on discharge  Diabetes mellitus type 2 -Hemoglobin A1c 10.5 -Continue sliding scale insulin NovoLog -CBG fairly controlled -We will add Lantus 5 units subcu daily   Elevated D-dimer -CT chest was negative for pulmonary embolism  Normocytic anemia -No melena or hematemesis -Iron studies shows iron 57, TIBC 237, saturation ratio 17%, ferritin 98  Chronic exertional shortness of breath: Abnormal CT coronary artery: -Troponin x2 negative.  EKG: No acute changes. -Followed by cardiology Dr. Eldridge Parker -Patient was recommended strict lifestyle modification   Scheduled  medications:    enoxaparin (LOVENOX) injection  40 mg Subcutaneous Q24H   insulin aspart  0-15 Units Subcutaneous TID WC   insulin aspart  0-5 Units Subcutaneous QHS   losartan  100 mg Oral Daily   rosuvastatin  20 mg Oral Daily   sodium chloride flush  3 mL Intravenous Q12H     Data Reviewed:   CBG:  Recent Labs  Lab 02/24/21 1615 02/24/21 2108 02/25/21 0540 02/25/21 0741 02/25/21 1150  GLUCAP 75 236* 158* 152* 225*    SpO2: 97 %    Vitals:   02/24/21 1619 02/24/21 1900 02/25/21 0542 02/25/21 1152  BP: 133/80 121/75 121/77 116/79  Pulse: 75 72 77 67  Resp: 19 19  18   Temp: 99.3 F (37.4 C) 99.4 F (37.4 C) 98.8 F (37.1 C) 99 F (37.2 C)  TempSrc: Oral Oral Oral Oral  SpO2: 96% 98% 95% 97%  Weight:   89.2 kg   Height:         Intake/Output Summary (Last 24 hours) at 02/25/2021 1553 Last data filed at 02/24/2021 1852 Gross per 24 hour  Intake 600 ml  Output --  Net 600 ml    08/26 1901 - 08/28 0700 In: 1560 [P.O.:1560] Out: 475 [Urine:475]  Filed Weights   02/23/21 0858 02/24/21 0500 02/25/21 0542  Weight: 88.5 kg 91.6 kg 89.2 kg    CBC:  Recent Labs  Lab 02/23/21 0900 02/24/21 0437  WBC 7.5 4.0  HGB 10.5* 9.9*  HCT 28.8* 27.4*  PLT 186 190  MCV 80.7 79.7*  MCH 29.4 28.8  MCHC 36.5* 36.1*  RDW 11.3* 11.5    Complete metabolic panel:  Recent Labs  Lab 02/23/21 0900 02/23/21 1418 02/23/21 1419  02/23/21 1423 02/24/21 0437  NA 132*  --   --   --  137  K 3.9  --   --   --  3.5  CL 99  --   --   --  102  CO2 25  --   --   --  27  GLUCOSE 245*  --   --   --  140*  BUN 18  --   --   --  15  CREATININE 1.15  --   --   --  1.24  CALCIUM 8.6*  --   --   --  8.5*  MG  --  1.4*  --   --  2.2  CRP  --   --   --  4.7*  --   DDIMER  --  0.95*  --   --   --   PROCALCITON  --  <0.10  --   --   --   TSH  --   --  0.465  --   --   HGBA1C  --   --   --   --  10.3*  BNP  --   --   --  60.3  --     No results for input(s): LIPASE, AMYLASE  in the last 168 hours.  Recent Labs  Lab 02/23/21 0900 02/23/21 1418 02/23/21 1423  CRP  --   --  4.7*  DDIMER  --  0.95*  --   BNP  --   --  60.3  PROCALCITON  --  <0.10  --   SARSCOV2NAA POSITIVE*  --   --     ------------------------------------------------------------------------------------------------------------------ No results for input(s): CHOL, HDL, LDLCALC, TRIG, CHOLHDL, LDLDIRECT in the last 72 hours.  Lab Results  Component Value Date   HGBA1C 10.3 (H) 02/24/2021   ------------------------------------------------------------------------------------------------------------------ Recent Labs    02/23/21 1419  TSH 0.465   ------------------------------------------------------------------------------------------------------------------ Recent Labs    02/23/21 1419 02/23/21 1423  FERRITIN 98 103  TIBC 337  --   IRON 57  --     Coagulation profile No results for input(s): INR, PROTIME in the last 168 hours. Recent Labs    02/23/21 1418  DDIMER 0.95*    Cardiac Enzymes No results for input(s): CKTOTAL, CKMB, CKMBINDEX, TROPONINI in the last 168 hours.  ------------------------------------------------------------------------------------------------------------------    Component Value Date/Time   BNP 60.3 02/23/2021 1423     Antibiotics: Anti-infectives (From admission, onward)    None        Radiology Reports  ECHOCARDIOGRAM LIMITED  Result Date: 02/24/2021    ECHOCARDIOGRAM LIMITED REPORT   Patient Name:   Andrew Parker Date of Exam: 02/24/2021 Medical Rec #:  [id number]      Height:       69.0 in Accession #:    [id number]     Weight:       201.9 lb Date of Birth:  09-27-1955      BSA:          2.075 m Patient Age:    65 years       BP:           114/71 mmHg Patient Gender: M              HR:           69 bpm. Exam Location:  Inpatient Procedure: Limited Echo, Cardiac Doppler and Color Doppler Indications:    R55 Syncope  History:  Patient has no prior history of Echocardiogram examinations.                 Signs/Symptoms:Syncope; Risk Factors:Hypertension, Dyslipidemia                 and Diabetes. Covid positive.  Sonographer:    Sheralyn Boatman RDCS Referring Phys: [id number] Andrew Parker [facility] IMPRESSIONS  1. Left ventricular ejection fraction, by estimation, is 60 to 65%. The left ventricle has normal function. The left ventricle has no regional wall motion abnormalities. There is mild left ventricular hypertrophy. Left ventricular diastolic parameters are consistent with Grade I diastolic dysfunction (impaired relaxation).  2. Right ventricular systolic function is normal. The right ventricular size is normal.  3. The mitral valve is normal in structure. No evidence of mitral valve regurgitation. No evidence of mitral stenosis.  4. The aortic valve was not well visualized.  5. The inferior vena cava is normal in size with greater than 50% respiratory variability, suggesting right atrial pressure of 3 mmHg.  6. Limited echo for syncope FINDINGS  Left Ventricle: Left ventricular ejection fraction, by estimation, is 60 to 65%. The left ventricle has normal function. The left ventricle has no regional wall motion abnormalities. There is mild left ventricular hypertrophy. Left ventricular diastolic  parameters are consistent with Grade I diastolic dysfunction (impaired relaxation). Right Ventricle: The right ventricular size is normal. No increase in right ventricular wall thickness. Right ventricular systolic function is normal. Mitral Valve: The mitral valve is normal in structure. No evidence of mitral valve stenosis. Tricuspid Valve: The tricuspid valve is normal in structure. Tricuspid valve regurgitation is not demonstrated. No evidence of tricuspid stenosis. Aortic Valve: The aortic valve was not well visualized. Pulmonic Valve: The pulmonic valve was not well visualized. Pulmonic valve regurgitation is not visualized. No evidence of pulmonic  stenosis. Aorta: The aortic root is normal in size and structure. Venous: The inferior vena cava is normal in size with greater than 50% respiratory variability, suggesting right atrial pressure of 3 mmHg. LEFT VENTRICLE PLAX 2D LVIDd:         3.70 cm LVIDs:         2.20 cm LV PW:         1.20 cm LV IVS:        1.20 cm LVOT diam:     2.10 cm     3D Volume EF: LVOT Area:     3.46 cm    3D EF:        60 %                            LV EDV:       195 ml                            LV ESV:       78 ml LV Volumes (MOD)           LV SV:        116 ml LV vol d, MOD A2C: 41.3 ml LV vol d, MOD A4C: 72.4 ml LV vol s, MOD A2C: 15.8 ml LV vol s, MOD A4C: 30.5 ml LV SV MOD A2C:     25.5 ml LV SV MOD A4C:     72.4 ml LV SV MOD BP:      36.5 ml IVC IVC diam: 1.40 cm LEFT ATRIUM  Index LA diam:    3.00 cm 1.45 cm/m   AORTA Ao Root diam: 3.70 cm MITRAL VALVE MV Area (PHT): 3.65 cm     SHUNTS MV Decel Time: 208 msec     Systemic Diam: 2.10 cm MV E velocity: 82.30 cm/s MV A velocity: 110.00 cm/s MV E/A ratio:  0.75 Dina Rich MD Electronically signed by Dina Rich MD Signature Date/Time: 02/24/2021/9:21:00 AM    Final       DVT prophylaxis: Lovenox  Code Status: Full code  Family Communication: No family at bedside   Consultants:   Procedures:     Objective    Physical Examination:   General-appears in no acute distress Heart-S1-S2, regular, no murmur auscultated Lungs-clear to auscultation bilaterally, no wheezing or crackles auscultated Abdomen-soft, nontender, no organomegaly Extremities-no edema in the lower extremities Neuro-alert, oriented x3, no focal deficit noted  Status is: Inpatient  Dispo: The patient is from: Home              Anticipated d/c is to: Home              Anticipated d/c date is: 02/26/2021              Patient currently not stable for discharge  Barrier to discharge-ongoing evaluation for syncope  COVID-19 Labs  Recent Labs    02/23/21 1418  02/23/21 1419 02/23/21 1423  DDIMER 0.95*  --   --   FERRITIN  --  98 103  CRP  --   --  4.7*    Lab Results  Component Value Date   SARSCOV2NAA POSITIVE (A) 02/23/2021    Microbiology  Recent Results (from the past 240 hour(s))  Resp Panel by RT-PCR (Flu A&B, Covid) Nasopharyngeal Swab     Status: Abnormal   Collection Time: 02/23/21  9:00 AM   Specimen: Nasopharyngeal Swab; Nasopharyngeal(NP) swabs in vial transport medium  Result Value Ref Range Status   SARS Coronavirus 2 by RT PCR POSITIVE (A) NEGATIVE Final    Comment: RESULT CALLED TO, READ BACK BY AND VERIFIED WITH: JOHNSON,L RN ON 02/23/21 @ 1106 BY GOLSONM (NOTE) SARS-CoV-2 target nucleic acids are DETECTED.  The SARS-CoV-2 RNA is generally detectable in upper respiratory specimens during the acute phase of infection. Positive results are indicative of the presence of the identified virus, but do not rule out bacterial infection or co-infection with other pathogens not detected by the test. Clinical correlation with patient history and other diagnostic information is necessary to determine patient infection status. The expected result is Negative.  Fact Sheet for Patients: BloggerCourse.com  Fact Sheet for Healthcare Providers: SeriousBroker.it  This test is not yet approved or cleared by the [place] FDA and  has been authorized for detection and/or diagnosis of SARS-CoV-2 by FDA under an Emergency Use Authorization (EUA).  This EUA will remain in effect (meaning this tes t can be used) for the duration of  the COVID-19 declaration under Section 564(b)(1) of the Act, 21 U.S.C. section 360bbb-3(b)(1), unless the authorization is terminated or revoked sooner.     Influenza A by PCR NEGATIVE NEGATIVE Final   Influenza B by PCR NEGATIVE NEGATIVE Final    Comment: (NOTE) The Xpert Xpress SARS-CoV-2/FLU/RSV plus assay is intended as an aid in the diagnosis  of influenza from Nasopharyngeal swab specimens and should not be used as a sole basis for treatment. Nasal washings and aspirates are unacceptable for Xpert Xpress SARS-CoV-2/FLU/RSV testing.  Fact Sheet for Patients: BloggerCourse.com  Fact  Sheet for Healthcare Providers: SeriousBroker.it  This test is not yet approved or cleared by the [place] and has been authorized for detection and/or diagnosis of SARS-CoV-2 by FDA under an Emergency Use Authorization (EUA). This EUA will remain in effect (meaning this test can be used) for the duration of the COVID-19 declaration under Section 564(b)(1) of the Act, 21 U.S.C. section 360bbb-3(b)(1), unless the authorization is terminated or revoked.  Performed at [facility], [address], [place], [place] [address]          Meredeth Ide   [facility] If 7PM-7AM, please contact night-coverage at [email or web address], Office  [phone]   02/25/2021, 3:53 PM  LOS: 2 days

## 2021-02-25 NOTE — Progress Notes (Signed)
[facility]  PROGRESS NOTE  Andrew AhmadiRobert Parker ZOX:[id number] DOB: 1956/06/06 DOA: 02/23/2021 PCP: Tally JoeSwayne, David, MD   Brief HPI:   65 year old male with medical history of hypertension, hyperlipidemia, diabetes mellitus type 2, asthma/allergic rhinitis presented for evaluation of syncope patient was in restroom this morning patient said that he felt dizzy before syncopal episode.  No chest pain shortness of breath. he had CT coronary artery recently which shows calcium score of 220, 83rd percentile for age, race, sex.  Mild 25 to 49% plaque in the left circumflex and minimal plaque less than 25% in the RCA and LAD.  Recommend aggressive risk factor modification   Subjective   Patient seen and examined, denies chest pain or shortness of breath   Assessment/Plan:     Syncope -Unclear etiology; will check orthostatic vital signs every 4 hours x3 -Continue monitoring on telemetry -Follow echocardiogram -Routine EEG to rule out seizure   COVID-19 infection -Incidental finding -He has chronic shortness of breath for multiple months -CT chest negative for acute findings -No treatment at this time  Hyponatremia -Sodium 132, likely due to HCTZ  Hypertension -Blood pressure stable -HCTZ on hold -Continue losartan  Diabetes mellitus type 2 -Continue sliding scale insulin NovoLog -CBG fairly controlled  Normocytic anemia -No melena or hematemesis -Iron studies shows iron 57, TIBC 237, saturation ratio 17%, ferritin 98  Chronic exertional shortness of breath: Abnormal CT coronary artery: -Troponin x2 negative.  EKG: No acute changes. -Followed by cardiology Dr. Eldridge DaceVaranasi -Patient was recommended strict lifestyle modification   Scheduled medications:    enoxaparin (LOVENOX) injection  40 mg Subcutaneous Q24H   insulin aspart  0-15 Units Subcutaneous TID WC   insulin aspart  0-5 Units Subcutaneous QHS   losartan  100 mg Oral Daily   rosuvastatin  20 mg Oral Daily    sodium chloride flush  3 mL Intravenous Q12H         Data Reviewed:   CBG:  Recent Labs  Lab 02/24/21 1615 02/24/21 2108 02/25/21 0540 02/25/21 0741 02/25/21 1150  GLUCAP 75 236* 158* 152* 225*    SpO2: 97 %    Vitals:   02/24/21 1619 02/24/21 1900 02/25/21 0542 02/25/21 1152  BP: 133/80 121/75 121/77 116/79  Pulse: 75 72 77 67  Resp: 19 19  18   Temp: 99.3 F (37.4 C) 99.4 F (37.4 C) 98.8 F (37.1 C) 99 F (37.2 C)  TempSrc: Oral Oral Oral Oral  SpO2: 96% 98% 95% 97%  Weight:   89.2 kg   Height:         Intake/Output Summary (Last 24 hours) at 02/25/2021 1542 Last data filed at 02/24/2021 1852 Gross per 24 hour  Intake 600 ml  Output --  Net 600 ml    08/26 1901 - 08/28 0700 In: 1560 [P.O.:1560] Out: 475 [Urine:475]  Filed Weights   02/23/21 0858 02/24/21 0500 02/25/21 0542  Weight: 88.5 kg 91.6 kg 89.2 kg    CBC:  Recent Labs  Lab 02/23/21 0900 02/24/21 0437  WBC 7.5 4.0  HGB 10.5* 9.9*  HCT 28.8* 27.4*  PLT 186 190  MCV 80.7 79.7*  MCH 29.4 28.8  MCHC 36.5* 36.1*  RDW 11.3* 11.5    Complete metabolic panel:  Recent Labs  Lab 02/23/21 0900 02/23/21 1418 02/23/21 1419 02/23/21 1423 02/24/21 0437  NA 132*  --   --   --  137  K 3.9  --   --   --  3.5  CL 99  --   --   --  102  CO2 25  --   --   --  27  GLUCOSE 245*  --   --   --  140*  BUN 18  --   --   --  15  CREATININE 1.15  --   --   --  1.24  CALCIUM 8.6*  --   --   --  8.5*  MG  --  1.4*  --   --  2.2  CRP  --   --   --  4.7*  --   DDIMER  --  0.95*  --   --   --   PROCALCITON  --  <0.10  --   --   --   TSH  --   --  0.465  --   --   HGBA1C  --   --   --   --  10.3*  BNP  --   --   --  60.3  --     No results for input(s): LIPASE, AMYLASE in the last 168 hours.  Recent Labs  Lab 02/23/21 0900 02/23/21 1418 02/23/21 1423  CRP  --   --  4.7*  DDIMER  --  0.95*  --   BNP  --   --  60.3  PROCALCITON  --  <0.10  --   SARSCOV2NAA POSITIVE*  --   --      ------------------------------------------------------------------------------------------------------------------ No results for input(s): CHOL, HDL, LDLCALC, TRIG, CHOLHDL, LDLDIRECT in the last 72 hours.  Lab Results  Component Value Date   HGBA1C 10.3 (H) 02/24/2021   ------------------------------------------------------------------------------------------------------------------ Recent Labs    02/23/21 1419  TSH 0.465   ------------------------------------------------------------------------------------------------------------------ Recent Labs    02/23/21 1419 02/23/21 1423  FERRITIN 98 103  TIBC 337  --   IRON 57  --     Coagulation profile No results for input(s): INR, PROTIME in the last 168 hours. Recent Labs    02/23/21 1418  DDIMER 0.95*    Cardiac Enzymes No results for input(s): CKTOTAL, CKMB, CKMBINDEX, TROPONINI in the last 168 hours.  ------------------------------------------------------------------------------------------------------------------    Component Value Date/Time   BNP 60.3 02/23/2021 1423     Antibiotics: Anti-infectives (From admission, onward)    None        Radiology Reports  ECHOCARDIOGRAM LIMITED  Result Date: 02/24/2021    ECHOCARDIOGRAM LIMITED REPORT   Patient Name:   Andrew Parker Date of Exam: 02/24/2021 Medical Rec #:  [id number]      Height:       69.0 in Accession #:    [id number]     Weight:       201.9 lb Date of Birth:  1955-12-24      BSA:          2.075 m Patient Age:    65 years       BP:           114/71 mmHg Patient Gender: M              HR:           69 bpm. Exam Location:  Inpatient Procedure: Limited Echo, Cardiac Doppler and Color Doppler Indications:    R55 Syncope  History:        Patient has no prior history of Echocardiogram examinations.                 Signs/Symptoms:Syncope; Risk Factors:Hypertension, Dyslipidemia  and Diabetes. Covid positive.  Sonographer:    Sheralyn Boatman RDCS  Referring Phys: [id number] Kasandra Knudsen [facility] IMPRESSIONS  1. Left ventricular ejection fraction, by estimation, is 60 to 65%. The left ventricle has normal function. The left ventricle has no regional wall motion abnormalities. There is mild left ventricular hypertrophy. Left ventricular diastolic parameters are consistent with Grade I diastolic dysfunction (impaired relaxation).  2. Right ventricular systolic function is normal. The right ventricular size is normal.  3. The mitral valve is normal in structure. No evidence of mitral valve regurgitation. No evidence of mitral stenosis.  4. The aortic valve was not well visualized.  5. The inferior vena cava is normal in size with greater than 50% respiratory variability, suggesting right atrial pressure of 3 mmHg.  6. Limited echo for syncope FINDINGS  Left Ventricle: Left ventricular ejection fraction, by estimation, is 60 to 65%. The left ventricle has normal function. The left ventricle has no regional wall motion abnormalities. There is mild left ventricular hypertrophy. Left ventricular diastolic  parameters are consistent with Grade I diastolic dysfunction (impaired relaxation). Right Ventricle: The right ventricular size is normal. No increase in right ventricular wall thickness. Right ventricular systolic function is normal. Mitral Valve: The mitral valve is normal in structure. No evidence of mitral valve stenosis. Tricuspid Valve: The tricuspid valve is normal in structure. Tricuspid valve regurgitation is not demonstrated. No evidence of tricuspid stenosis. Aortic Valve: The aortic valve was not well visualized. Pulmonic Valve: The pulmonic valve was not well visualized. Pulmonic valve regurgitation is not visualized. No evidence of pulmonic stenosis. Aorta: The aortic root is normal in size and structure. Venous: The inferior vena cava is normal in size with greater than 50% respiratory variability, suggesting right atrial pressure of 3 mmHg. LEFT VENTRICLE  PLAX 2D LVIDd:         3.70 cm LVIDs:         2.20 cm LV PW:         1.20 cm LV IVS:        1.20 cm LVOT diam:     2.10 cm     3D Volume EF: LVOT Area:     3.46 cm    3D EF:        60 %                            LV EDV:       195 ml                            LV ESV:       78 ml LV Volumes (MOD)           LV SV:        116 ml LV vol d, MOD A2C: 41.3 ml LV vol d, MOD A4C: 72.4 ml LV vol s, MOD A2C: 15.8 ml LV vol s, MOD A4C: 30.5 ml LV SV MOD A2C:     25.5 ml LV SV MOD A4C:     72.4 ml LV SV MOD BP:      36.5 ml IVC IVC diam: 1.40 cm LEFT ATRIUM         Index LA diam:    3.00 cm 1.45 cm/m   AORTA Ao Root diam: 3.70 cm MITRAL VALVE MV Area (PHT): 3.65 cm     SHUNTS MV Decel Time: 208 msec  Systemic Diam: 2.10 cm MV E velocity: 82.30 cm/s MV A velocity: 110.00 cm/s MV E/A ratio:  0.75 Dina Rich MD Electronically signed by Dina Rich MD Signature Date/Time: 02/24/2021/9:21:00 AM    Final       DVT prophylaxis: Lovenox  Code Status: Full code  Family Communication: No family at bedside   Consultants:   Procedures:     Objective    Physical Examination:   General-appears in no acute distress Heart-S1-S2, regular, no murmur auscultated Lungs-clear to auscultation bilaterally, no wheezing or crackles auscultated Abdomen-soft, nontender, no organomegaly Extremities-no edema in the lower extremities Neuro-alert, oriented x3, no focal deficit noted  Status is: Inpatient  Dispo: The patient is from: Home              Anticipated d/c is to: Home              Anticipated d/c date is: 02/26/2021              Patient currently not stable for discharge  Barrier to discharge-ongoing evaluation for syncope  COVID-19 Labs  Recent Labs    02/23/21 1418 02/23/21 1419 02/23/21 1423  DDIMER 0.95*  --   --   FERRITIN  --  98 103  CRP  --   --  4.7*    Lab Results  Component Value Date   SARSCOV2NAA POSITIVE (A) 02/23/2021    Microbiology  Recent Results (from the  past 240 hour(s))  Resp Panel by RT-PCR (Flu A&B, Covid) Nasopharyngeal Swab     Status: Abnormal   Collection Time: 02/23/21  9:00 AM   Specimen: Nasopharyngeal Swab; Nasopharyngeal(NP) swabs in vial transport medium  Result Value Ref Range Status   SARS Coronavirus 2 by RT PCR POSITIVE (A) NEGATIVE Final    Comment: RESULT CALLED TO, READ BACK BY AND VERIFIED WITH: JOHNSON,L RN ON 02/23/21 @ 1106 BY GOLSONM (NOTE) SARS-CoV-2 target nucleic acids are DETECTED.  The SARS-CoV-2 RNA is generally detectable in upper respiratory specimens during the acute phase of infection. Positive results are indicative of the presence of the identified virus, but do not rule out bacterial infection or co-infection with other pathogens not detected by the test. Clinical correlation with patient history and other diagnostic information is necessary to determine patient infection status. The expected result is Negative.  Fact Sheet for Patients: BloggerCourse.com  Fact Sheet for Healthcare Providers: SeriousBroker.it  This test is not yet approved or cleared by the [place] FDA and  has been authorized for detection and/or diagnosis of SARS-CoV-2 by FDA under an Emergency Use Authorization (EUA).  This EUA will remain in effect (meaning this tes t can be used) for the duration of  the COVID-19 declaration under Section 564(b)(1) of the Act, 21 U.S.C. section 360bbb-3(b)(1), unless the authorization is terminated or revoked sooner.     Influenza A by PCR NEGATIVE NEGATIVE Final   Influenza B by PCR NEGATIVE NEGATIVE Final    Comment: (NOTE) The Xpert Xpress SARS-CoV-2/FLU/RSV plus assay is intended as an aid in the diagnosis of influenza from Nasopharyngeal swab specimens and should not be used as a sole basis for treatment. Nasal washings and aspirates are unacceptable for Xpert Xpress SARS-CoV-2/FLU/RSV testing.  Fact Sheet for  Patients: BloggerCourse.com  Fact Sheet for Healthcare Providers: SeriousBroker.it  This test is not yet approved or cleared by the [place] FDA and has been authorized for detection and/or diagnosis of SARS-CoV-2 by FDA under an Emergency Use Authorization (EUA). This EUA  will remain in effect (meaning this test can be used) for the duration of the COVID-19 declaration under Section 564(b)(1) of the Act, 21 U.S.C. section 360bbb-3(b)(1), unless the authorization is terminated or revoked.  Performed at [facility], [address], [place], [place] [address]          Meredeth Ide   [facility] If 7PM-7AM, please contact night-coverage at [email or web address], Office  [phone]   02/25/2021, 3:42 PM  LOS: 2 days

## 2021-02-25 NOTE — Care Management Obs Status (Addendum)
MEDICARE OBSERVATION STATUS NOTIFICATION   Patient Details  Name: Darral Rishel MRN: [id number] Date of Birth: Nov 05, 1955   Medicare Observation Status Notification Given:   Colen Darling, RN 02/25/2021, 9:56 AM

## 2021-02-26 ENCOUNTER — Observation Stay (HOSPITAL_COMMUNITY)
Admit: 2021-02-26 | Discharge: 2021-02-26 | Disposition: A | Discharge status: Home / Self Care | Payer: Medicare Other | Attending: Family Medicine | Admitting: Family Medicine

## 2021-02-26 DIAGNOSIS — R55 Syncope and collapse: Secondary | ICD-10-CM | POA: Diagnosis not present

## 2021-02-26 DIAGNOSIS — U071 COVID-19: Secondary | ICD-10-CM | POA: Diagnosis not present

## 2021-02-26 DIAGNOSIS — E1165 Type 2 diabetes mellitus with hyperglycemia: Secondary | ICD-10-CM | POA: Diagnosis not present

## 2021-02-26 LAB — GLUCOSE, CAPILLARY
Glucose-Capillary: 138 mg/dL — ABNORMAL HIGH (ref 70–99)
Glucose-Capillary: 138 mg/dL — ABNORMAL HIGH (ref 70–99)

## 2021-02-26 MED ORDER — LOSARTAN POTASSIUM 100 MG PO TABS: 100.0000 mg | ORAL_TABLET | Freq: Every day | ORAL | 0 refills | Status: AC

## 2021-02-26 MED ORDER — BLOOD GLUCOSE MONITOR KIT: PACK | 0 refills | Status: AC

## 2021-02-26 NOTE — Discharge Instructions (Addendum)
Your Hgb A1c is 10.3- please be careful with what you are eating-- keep tract of your blood sugars and bring to PCP.  I suspect you may need another medication to help control your blood sugars Your EEG was negative for seizures

## 2021-02-26 NOTE — Discharge Summary (Signed)
Physician Discharge Summary  Andrew Parker JKK:[id number] DOB: Dec 09, 1955 DOA: 02/23/2021  PCP: Antony Contras, MD  Admit date: 02/23/2021 Discharge date: 02/26/2021  Admitted From: home Discharge disposition: home with syncope precautions    Recommendations for Outpatient Follow-Up:   Needs better control of his blood sugars   Discharge Diagnosis:   Principal Problem:   Syncope Active Problems:   Hypertension   Mixed hyperlipidemia   Type 2 diabetes mellitus with hyperglycemia ([place])   COVID-19 virus infection   Normocytic anemia    Discharge Condition: Improved.  Diet recommendation: Low sodium, heart healthy.  Carbohydrate-modified..  Wound care: None.  Code status: Full.   History of Present Illness:   Andrew Parker is a 65 y.o. male with medical history significant of hypertension, hyperlipidemia, type 2 diabetes, erectile dysfunction, asthma/allergic rhinitis presents here for evaluation of syncope.   Patient's wife reports that while patient was in the restroom this morning he fell on the ground however no head trauma, loss of consciousness, seizure-like activity reported.  He tells me that he felt weak and dizzy before the syncopal episode.  He does not remember anything about the episode.     No chest pain, fever, chills, nausea, vomiting, diarrhea, melena, hematemesis, epigastric pain, urinary problems including dysuria, hematuria, decreased appetite.   He has chronic exertional shortness of breath which has been going on for more than 2 months. has some dry cough.  Wife reports that patient started feeling bad yesterday, he feels weak and fatigue.  he had CT coronary artery recently which shows calcium score of 220, 83rd percentile for age, race, sex.  Mild 25 to 49% plaque in the left circumflex and minimal plaque less than 25% in the RCA and LAD.  Recommend aggressive risk factor modification.   He is fully vaccinated against COVID-19 and booster x1.   No smoking, alcohol, licit drug use.  Lives with his wife at home.  Does not use walker or cane.  No previous history of syncope.   Hospital Course by Problem:   Syncope -Unclear etiology; orthostatics normal -tele negative -Echocardiogram shows EF of 60 to 65%; mild LVH, grade 1 diastolic dysfunction -EEG negative for seizures -suspect related to COVID 19   COVID-19 infection -Incidental finding -He has chronic shortness of breath for multiple months -CT chest negative for acute findings -No treatment at this time   Hyponatremia -Sodium was 132, likely due to HCTZ -Improved to 137    Hypertension -Blood pressure stable -Continue losartan;  -discontinue HCTZ on discharge   Diabetes mellitus type 2 -Hemoglobin A1c 10.5 -needs better control -defer to PCP   Elevated D-dimer -CT chest was negative for pulmonary embolism -related to COVID 19   Normocytic anemia -No melena or hematemesis -Iron studies shows iron 57, TIBC 237, saturation ratio 17%, ferritin 98   Chronic exertional shortness of breath: Abnormal CT coronary artery: -Troponin x2 negative.  EKG: No acute changes. -Followed by cardiology Dr. Irish Lack -Patient was recommended strict lifestyle modification ->?h/o sarcoid-- outpatient follo w up    Medical Consultants:      Discharge Exam:   Vitals:   02/25/21 2106 02/26/21 0420  BP: 132/81 113/70  Pulse: 63 68  Resp:    Temp:  98.6 F (37 C)  SpO2: 99% 100%   Vitals:   02/25/21 1152 02/25/21 2106 02/26/21 0420 02/26/21 0423  BP: 116/79 132/81 113/70   Pulse: 67 63 68   Resp: 18     Temp: 99  F (37.2 C)  98.6 F (37 C)   TempSrc: Oral  Oral   SpO2: 97% 99% 100%   Weight:    88.9 kg  Height:        General exam: Appears calm and comfortable.     The results of significant diagnostics from this hospitalization (including imaging, microbiology, ancillary and laboratory) are listed below for reference.     Procedures and Diagnostic  Studies:   No results found.   Labs:   Basic Metabolic Panel: Recent Labs  Lab 02/23/21 0900 02/23/21 1418 02/24/21 0437  NA 132*  --  137  K 3.9  --  3.5  CL 99  --  102  CO2 25  --  27  GLUCOSE 245*  --  140*  BUN 18  --  15  CREATININE 1.15  --  1.24  CALCIUM 8.6*  --  8.5*  MG  --  1.4* 2.2  PHOS  --  3.1  --    GFR Estimated Creatinine Clearance: 64.7 mL/min (by C-G formula based on SCr of 1.24 mg/dL). Liver Function Tests: No results for input(s): AST, ALT, ALKPHOS, BILITOT, PROT, ALBUMIN in the last 168 hours. No results for input(s): LIPASE, AMYLASE in the last 168 hours. No results for input(s): AMMONIA in the last 168 hours. Coagulation profile No results for input(s): INR, PROTIME in the last 168 hours.  CBC: Recent Labs  Lab 02/23/21 0900 02/24/21 0437  WBC 7.5 4.0  HGB 10.5* 9.9*  HCT 28.8* 27.4*  MCV 80.7 79.7*  PLT 186 190   Cardiac Enzymes: No results for input(s): CKTOTAL, CKMB, CKMBINDEX, TROPONINI in the last 168 hours. BNP: Invalid input(s): POCBNP CBG: Recent Labs  Lab 02/25/21 1150 02/25/21 1645 02/25/21 2107 02/26/21 0422 02/26/21 0746  GLUCAP 225* 198* 226* 138* 138*   D-Dimer No results for input(s): DDIMER in the last 72 hours.  Hgb A1c No results for input(s): HGBA1C in the last 72 hours.  Lipid Profile No results for input(s): CHOL, HDL, LDLCALC, TRIG, CHOLHDL, LDLDIRECT in the last 72 hours. Thyroid function studies No results for input(s): TSH, T4TOTAL, T3FREE, THYROIDAB in the last 72 hours.  Invalid input(s): FREET3  Anemia work up No results for input(s): VITAMINB12, FOLATE, FERRITIN, TIBC, IRON, RETICCTPCT in the last 72 hours.  Microbiology Recent Results (from the past 240 hour(s))  Resp Panel by RT-PCR (Flu A&B, Covid) Nasopharyngeal Swab     Status: Abnormal   Collection Time: 02/23/21  9:00 AM   Specimen: Nasopharyngeal Swab; Nasopharyngeal(NP) swabs in vial transport medium  Result Value Ref Range  Status   SARS Coronavirus 2 by RT PCR POSITIVE (A) NEGATIVE Final    Comment: RESULT CALLED TO, READ BACK BY AND VERIFIED WITH: JOHNSON,L RN ON 02/23/21 @ 1106 BY GOLSONM (NOTE) SARS-CoV-2 target nucleic acids are DETECTED.  The SARS-CoV-2 RNA is generally detectable in upper respiratory specimens during the acute phase of infection. Positive results are indicative of the presence of the identified virus, but do not rule out bacterial infection or co-infection with other pathogens not detected by the test. Clinical correlation with patient history and other diagnostic information is necessary to determine patient infection status. The expected result is Negative.  Fact Sheet for Patients: EntrepreneurPulse.com.au  Fact Sheet for Healthcare Providers: IncredibleEmployment.be  This test is not yet approved or cleared by the [place] FDA and  has been authorized for detection and/or diagnosis of SARS-CoV-2 by FDA under an Emergency Use Authorization (EUA).  This  EUA will remain in effect (meaning this tes t can be used) for the duration of  the COVID-19 declaration under Section 564(b)(1) of the Act, 21 U.S.C. section 360bbb-3(b)(1), unless the authorization is terminated or revoked sooner.     Influenza A by PCR NEGATIVE NEGATIVE Final   Influenza B by PCR NEGATIVE NEGATIVE Final    Comment: (NOTE) The Xpert Xpress SARS-CoV-2/FLU/RSV plus assay is intended as an aid in the diagnosis of influenza from Nasopharyngeal swab specimens and should not be used as a sole basis for treatment. Nasal washings and aspirates are unacceptable for Xpert Xpress SARS-CoV-2/FLU/RSV testing.  Fact Sheet for Patients: EntrepreneurPulse.com.au  Fact Sheet for Healthcare Providers: IncredibleEmployment.be  This test is not yet approved or cleared by the [place] FDA and has been authorized for detection and/or  diagnosis of SARS-CoV-2 by FDA under an Emergency Use Authorization (EUA). This EUA will remain in effect (meaning this test can be used) for the duration of the COVID-19 declaration under Section 564(b)(1) of the Act, 21 U.S.C. section 360bbb-3(b)(1), unless the authorization is terminated or revoked.  Performed at [facility], [place] [address] [place], [place] [address]      Discharge Instructions:   Discharge Instructions     Diet - low sodium heart healthy   Complete by: As directed    Diet Carb Modified   Complete by: As directed    Increase activity slowly   Complete by: As directed       Allergies as of 02/26/2021   No Known Allergies      Medication List     STOP taking these medications    losartan-hydrochlorothiazide 100-12.5 MG tablet Commonly known as: HYZAAR   metoprolol tartrate 100 MG tablet Commonly known as: LOPRESSOR       TAKE these medications    albuterol 108 (90 Base) MCG/ACT inhaler Commonly known as: VENTOLIN HFA Inhale 2 puffs into the lungs every 4 (four) hours as needed for wheezing or shortness of breath.   blood glucose meter kit and supplies Kit Dispense based on patient and insurance preference. Use up to four times daily as directed.   losartan 100 MG tablet Commonly known as: COZAAR Take 1 tablet (100 mg total) by mouth daily.   metformin 1000 MG (OSM) 24 hr tablet Commonly known as: FORTAMET Take 2,000 mg by mouth in the morning and at bedtime.   rosuvastatin 20 MG tablet Commonly known as: CRESTOR Take 1 tablet (20 mg total) by mouth daily. What changed: when to take this   sildenafil 20 MG tablet Commonly known as: REVATIO Take 40-100 mg by mouth daily as needed (erectile dysfunction).        Follow-up Information     Antony Contras, MD Follow up in 1 week(s).   Specialty: Family Medicine Contact information: [address] [place] [address] [phone]          Jettie Booze, MD .   Specialties: Cardiology, Radiology, Interventional Cardiology Contact information: [address] [place] [address] [phone]                  Time coordinating discharge: 35 min  Signed:  Geradine Girt DO  [facility] 02/27/2021, 3:34 PM

## 2021-02-26 NOTE — Procedures (Signed)
Patient Name: Calvert Charland  MRN: [id number]  Epilepsy Attending: Charlsie Quest  Referring Physician/Provider: Dr Mauro Kaufmann Date: 02/26/2021 Duration: 24.12 mins  Patient history: 65yo M with syncope. EEG to evaluate for seizure.   Level of alertness: Awake, asleep  AEDs during EEG study: None  Technical aspects: This EEG study was done with scalp electrodes positioned according to the 10-20 International system of electrode placement. Electrical activity was acquired at a sampling rate of 500Hz  and reviewed with a high frequency filter of 70Hz  and a low frequency filter of 1Hz . EEG data were recorded continuously and digitally stored.   Description: The posterior dominant rhythm consists of 9-10 Hz activity of moderate voltage (25-35 uV) seen predominantly in posterior head regions, symmetric and reactive to eye opening and eye closing. Sleep was characterized by vertex waves, sleep spindles (12 to 14 Hz), maximal frontocentral region.  Physiologic photic driving was not seen during photic stimulation. Hyperventilation was not performed.     IMPRESSION: This study is within normal limits. No seizures or epileptiform discharges were seen throughout the recording.  Lukasz Rogus 

## 2021-02-26 NOTE — Plan of Care (Signed)
°  Problem: Health Behavior/Discharge Planning: °Goal: Ability to manage health-related needs will improve °Outcome: Progressing °  °Problem: Education: °Goal: Knowledge of General Education information will improve °Description: Including pain rating scale, medication(s)/side effects and non-pharmacologic comfort measures °Outcome: Progressing °  °Problem: Activity: °Goal: Risk for activity intolerance will decrease °Outcome: Progressing °  °

## 2021-02-26 NOTE — Plan of Care (Signed)
Patient discharged.

## 2021-02-26 NOTE — Progress Notes (Signed)
EEG complete - results pending 

## 2021-03-01 DIAGNOSIS — E871 Hypo-osmolality and hyponatremia: Secondary | ICD-10-CM | POA: Diagnosis not present

## 2021-03-01 DIAGNOSIS — U071 COVID-19: Secondary | ICD-10-CM | POA: Diagnosis not present

## 2021-03-01 DIAGNOSIS — I10 Essential (primary) hypertension: Secondary | ICD-10-CM | POA: Diagnosis not present

## 2021-03-01 DIAGNOSIS — N529 Male erectile dysfunction, unspecified: Secondary | ICD-10-CM | POA: Diagnosis not present

## 2021-04-18 ENCOUNTER — Other Ambulatory Visit: Payer: Medicare Other

## 2021-05-18 DIAGNOSIS — I10 Essential (primary) hypertension: Secondary | ICD-10-CM | POA: Diagnosis not present

## 2021-05-18 DIAGNOSIS — E1169 Type 2 diabetes mellitus with other specified complication: Secondary | ICD-10-CM | POA: Diagnosis not present

## 2021-05-18 DIAGNOSIS — D649 Anemia, unspecified: Secondary | ICD-10-CM | POA: Diagnosis not present

## 2021-05-18 DIAGNOSIS — E782 Mixed hyperlipidemia: Secondary | ICD-10-CM | POA: Diagnosis not present

## 2021-05-18 DIAGNOSIS — Z7984 Long term (current) use of oral hypoglycemic drugs: Secondary | ICD-10-CM | POA: Diagnosis not present

## 2021-05-18 DIAGNOSIS — N529 Male erectile dysfunction, unspecified: Secondary | ICD-10-CM | POA: Diagnosis not present

## 2021-07-18 ENCOUNTER — Other Ambulatory Visit: Payer: Self-pay | Admitting: Family Medicine

## 2021-07-18 DIAGNOSIS — Z136 Encounter for screening for cardiovascular disorders: Secondary | ICD-10-CM

## 2021-07-31 ENCOUNTER — Ambulatory Visit
Admission: RE | Admit: 2021-07-31 | Discharge: 2021-07-31 | Disposition: A | Discharge status: Home / Self Care | Payer: Medicare PPO | Source: Ambulatory Visit | Attending: Family Medicine | Admitting: Family Medicine

## 2021-07-31 ENCOUNTER — Other Ambulatory Visit: Payer: Self-pay

## 2021-07-31 DIAGNOSIS — Z136 Encounter for screening for cardiovascular disorders: Secondary | ICD-10-CM

## 2022-01-22 DIAGNOSIS — Z7984 Long term (current) use of oral hypoglycemic drugs: Secondary | ICD-10-CM | POA: Diagnosis not present

## 2022-01-22 DIAGNOSIS — I10 Essential (primary) hypertension: Secondary | ICD-10-CM | POA: Diagnosis not present

## 2022-01-22 DIAGNOSIS — D649 Anemia, unspecified: Secondary | ICD-10-CM | POA: Diagnosis not present

## 2022-01-22 DIAGNOSIS — E1169 Type 2 diabetes mellitus with other specified complication: Secondary | ICD-10-CM | POA: Diagnosis not present

## 2022-01-22 DIAGNOSIS — N529 Male erectile dysfunction, unspecified: Secondary | ICD-10-CM | POA: Diagnosis not present

## 2022-01-22 DIAGNOSIS — E782 Mixed hyperlipidemia: Secondary | ICD-10-CM | POA: Diagnosis not present

## 2022-07-25 DIAGNOSIS — I10 Essential (primary) hypertension: Secondary | ICD-10-CM | POA: Diagnosis not present

## 2022-07-25 DIAGNOSIS — E1169 Type 2 diabetes mellitus with other specified complication: Secondary | ICD-10-CM | POA: Diagnosis not present

## 2022-07-25 DIAGNOSIS — D649 Anemia, unspecified: Secondary | ICD-10-CM | POA: Diagnosis not present

## 2022-07-25 DIAGNOSIS — N529 Male erectile dysfunction, unspecified: Secondary | ICD-10-CM | POA: Diagnosis not present

## 2022-07-25 DIAGNOSIS — E782 Mixed hyperlipidemia: Secondary | ICD-10-CM | POA: Diagnosis not present

## 2022-09-20 DIAGNOSIS — E782 Mixed hyperlipidemia: Secondary | ICD-10-CM | POA: Diagnosis not present

## 2022-09-20 DIAGNOSIS — Z1331 Encounter for screening for depression: Secondary | ICD-10-CM | POA: Diagnosis not present

## 2022-09-20 DIAGNOSIS — I10 Essential (primary) hypertension: Secondary | ICD-10-CM | POA: Diagnosis not present

## 2022-09-20 DIAGNOSIS — Z125 Encounter for screening for malignant neoplasm of prostate: Secondary | ICD-10-CM | POA: Diagnosis not present

## 2022-09-20 DIAGNOSIS — E119 Type 2 diabetes mellitus without complications: Secondary | ICD-10-CM | POA: Diagnosis not present

## 2022-09-20 DIAGNOSIS — Z0000 Encounter for general adult medical examination without abnormal findings: Secondary | ICD-10-CM | POA: Diagnosis not present

## 2023-01-06 DIAGNOSIS — I10 Essential (primary) hypertension: Secondary | ICD-10-CM | POA: Diagnosis not present

## 2023-03-24 DIAGNOSIS — E782 Mixed hyperlipidemia: Secondary | ICD-10-CM | POA: Diagnosis not present

## 2023-03-24 DIAGNOSIS — E119 Type 2 diabetes mellitus without complications: Secondary | ICD-10-CM | POA: Diagnosis not present

## 2023-03-24 DIAGNOSIS — E1169 Type 2 diabetes mellitus with other specified complication: Secondary | ICD-10-CM | POA: Diagnosis not present

## 2023-03-24 DIAGNOSIS — I10 Essential (primary) hypertension: Secondary | ICD-10-CM | POA: Diagnosis not present

## 2023-03-24 DIAGNOSIS — N529 Male erectile dysfunction, unspecified: Secondary | ICD-10-CM | POA: Diagnosis not present

## 2023-03-24 DIAGNOSIS — D649 Anemia, unspecified: Secondary | ICD-10-CM | POA: Diagnosis not present

## 2023-04-03 ENCOUNTER — Encounter: Payer: Self-pay | Admitting: Podiatry

## 2023-04-03 ENCOUNTER — Ambulatory Visit: Payer: Medicare Other | Admitting: Podiatry

## 2023-04-03 DIAGNOSIS — L84 Corns and callosities: Secondary | ICD-10-CM

## 2023-04-03 DIAGNOSIS — M2042 Other hammer toe(s) (acquired), left foot: Secondary | ICD-10-CM

## 2023-04-03 DIAGNOSIS — E1165 Type 2 diabetes mellitus with hyperglycemia: Secondary | ICD-10-CM

## 2023-04-03 DIAGNOSIS — B351 Tinea unguium: Secondary | ICD-10-CM | POA: Diagnosis not present

## 2023-04-03 DIAGNOSIS — M2041 Other hammer toe(s) (acquired), right foot: Secondary | ICD-10-CM | POA: Diagnosis not present

## 2023-04-03 MED ORDER — TERBINAFINE HCL 250 MG PO TABS: 250.0000 mg | ORAL_TABLET | Freq: Every day | ORAL | 0 refills | Status: AC

## 2023-04-03 NOTE — Patient Instructions (Signed)
VISIT SUMMARY:  During your visit, we discussed your diabetes, a corn under your right fifth toe, and a discolored toenail. Your diabetes is well-managed with your current medication, metformin. The corn was treated by shaving it down and applying salicylic acid. We also discussed using a special cream (Urea cream 40%) and a dancer's pad in your shoe to help with this. The discoloration of your toenail is likely due to a previous injury and a fungal infection. We will start you on a medication for this and may consider laser treatment if needed.  YOUR PLAN:  -CORNS/CALLUSES: Corns and calluses are areas of thick, hardened, dead skin, caused by friction and pressure. We treated your corn by shaving it down and applying salicylic acid. We also recommended using a special cream (Urea cream 40%) and a dancer's pad in your shoe to help with this. We also discussed the possibility of getting diabetic shoes to prevent worsening of corns and calluses.  -ONYCHOMYCOSIS: Onychomycosis is a fungal infection that causes discoloration of the toenails. We will start you on a medication called terbinafine (Lamisil) to treat this. If the medication is not effective or causes side effects, we may consider laser treatment. We will check on the progress in four months.  -DIABETES: Diabetes is a condition that affects how your body uses blood sugar. Your diabetes is well-managed with your current medication, metformin. We will continue with this treatment and have scheduled an appointment for you to get fitted for diabetic shoes.  INSTRUCTIONS:  Continue taking your diabetes medication, metformin, as prescribed. Apply the urea cream to your corn as directed and use the dancer's pad in your shoe. Start taking the terbinafine (Lamisil) for your toenail discoloration. We will see you in four months to check on the progress of your toenail. In the meantime, if you have any side effects from the medication or any other  concerns, please contact the office.

## 2023-04-03 NOTE — Progress Notes (Signed)
  Subjective:  Patient ID: Andrew Belarus Jr., male    DOB: 1955/07/11,  MRN: [id number]  Chief Complaint  Patient presents with   Diabetes    NP, diabetes, A1c 7.5. CC IPK lesion right plantar 5th met head. Also concerned about black toenail fungal appearance right 1st, with some discoloration to other toenails    Discussed the use of AI scribe software for clinical note transcription with the patient, who gave verbal consent to proceed.  History of Present Illness   The patient, with a history of diabetes, presents with a corn under the fifth toe of the right foot and a discolored toenail. The corn is painful at times and the toenail has been discolored for several months. The patient reports a previous injury to the toe, which has resulted in persistent numbness. The patient's diabetes is managed with metformin and his most recent A1c was 7.5. He denies any history of liver or kidney disease and his recent lab work was normal. He also reports no regular alcohol consumption.          Objective:    Physical Exam   CARDIOVASCULAR: Good pulses. EXTREMITIES: Corn under fifth toe on right foot. Reducible hammertoe deformities and mild hallux valgus bilateral SKIN: Black and brown discoloration, crumbly texture on nail under fifth toe on right foot.           Results   Procedure: Corns and calluses debridement Description: The corn was debrided. Salicylic acid was applied to the smaller areas of the corn.  LABS A1c: 7.5      Assessment:   1. Onychomycosis   2. Callus of foot   3. Hammertoe of right foot   4. Hammertoe of left foot   5. Type 2 diabetes mellitus with hyperglycemia, without long-term current use of insulin (HCC)      Plan:  Patient was evaluated and treated and all questions answered.  Assessment and Plan    Corns/Calluses He has a painful corn under the fifth toe on the right foot, which was managed by shaving down the corn and applying salicylic acid. We  will apply urea cream 40% strength to soften dead skin and use a dancer's pad in his shoe to redistribute pressure. Considering diabetic shoes is advised to prevent worsening of corns/calluses.  Onychomycosis He presents with black and brown discoloration of the toenail on the right foot, likely secondary to previous trauma and subsequent fungal infection. We will start oral terbinafine (Lamisil) once daily for three months. Laser treatment may be considered if oral medication is not effective or causes side effects. He should return in four months to assess progress.  Diabetes His diabetes is well-controlled with metformin, with a recent A1c of 7.5. We will continue the current management and schedule an appointment for diabetic shoe fitting.          Return in about 4 months (around 08/04/2023) for follow up after nail fungus treatment.

## 2023-04-15 ENCOUNTER — Other Ambulatory Visit: Payer: Medicare Other

## 2023-05-15 DIAGNOSIS — Z23 Encounter for immunization: Secondary | ICD-10-CM | POA: Diagnosis not present

## 2023-07-07 DIAGNOSIS — L821 Other seborrheic keratosis: Secondary | ICD-10-CM | POA: Diagnosis not present

## 2023-07-07 DIAGNOSIS — L811 Chloasma: Secondary | ICD-10-CM | POA: Diagnosis not present

## 2023-08-16 ENCOUNTER — Other Ambulatory Visit: Payer: Self-pay | Admitting: Podiatry

## 2023-08-16 IMAGING — CT CT ANGIO CHEST
2 of 6 series · 18 of 36 positions shown · IV contrast (omnipaque)
Comparison: None.

CLINICAL DATA: 65-year-old male with syncopal episode and shortness
of breath, suspected pulmonary embolism

EXAM:
CT ANGIOGRAPHY CHEST WITH CONTRAST
TECHNIQUE: Multidetector CT imaging of the chest was performed using the
standard protocol during bolus administration of intravenous
contrast. Multiplanar CT image reconstructions and MIPs were
obtained to evaluate the vascular anatomy.
CONTRAST:  Seventy-five mL Omnipaque 350, intravenous

[Series 5: thins · axial · 0.77mm/px · z∈[+1371,+1625]mm · 17 of 286 slices shown]
[im 16/286  lung]
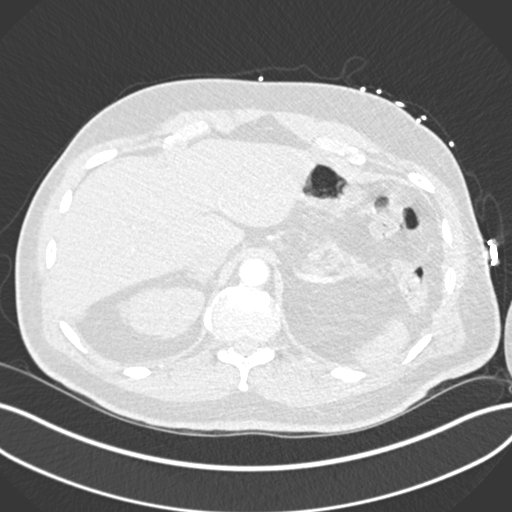
[im 32/286  mediastinal]
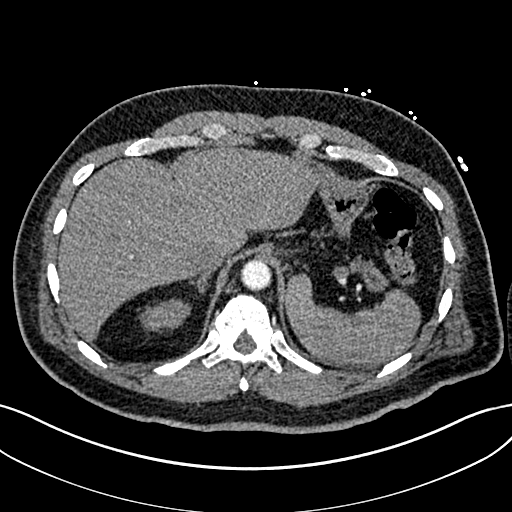
[im 48/286  lung]
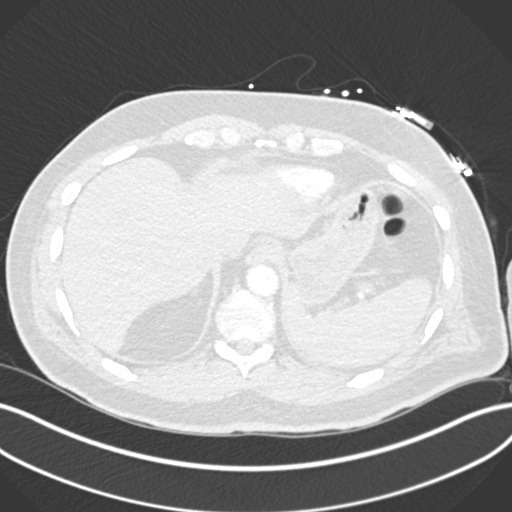
[im 64/286  mediastinal]
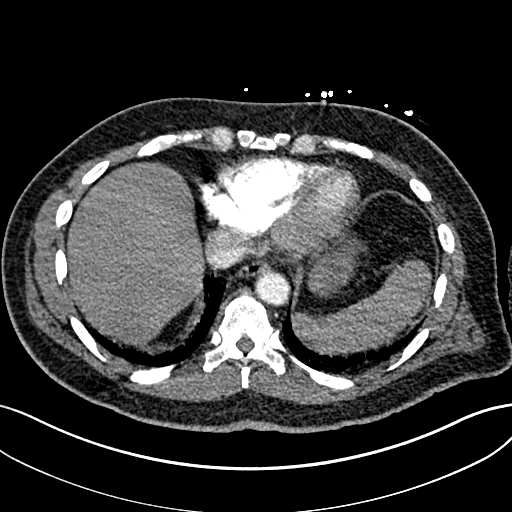
[im 80/286  lung]
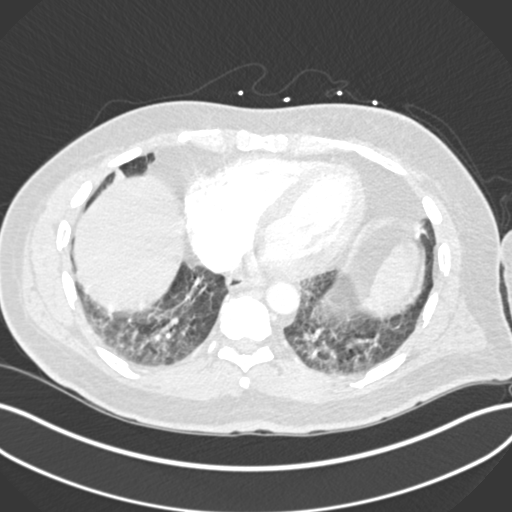
[im 96/286  mediastinal]
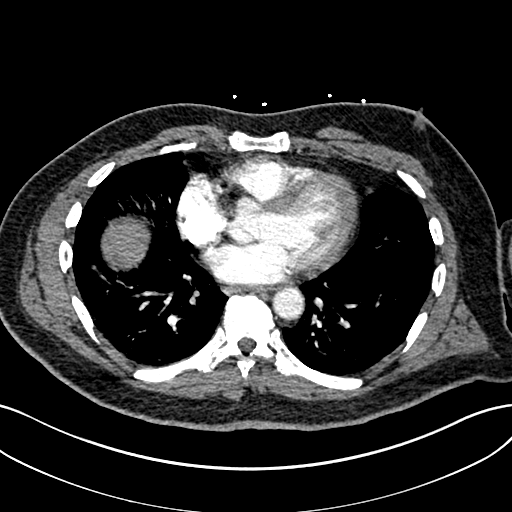
[im 111/286  lung]
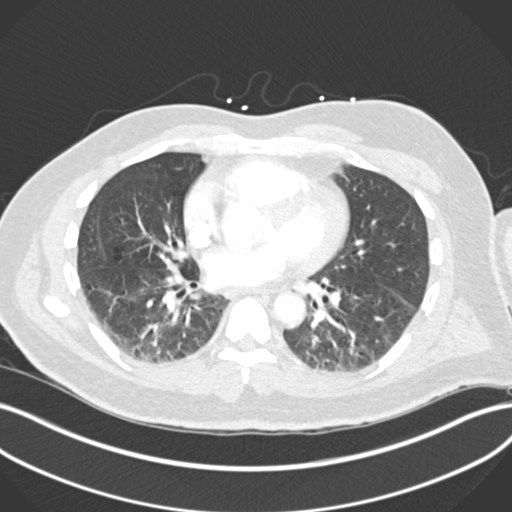
[im 127/286  mediastinal]
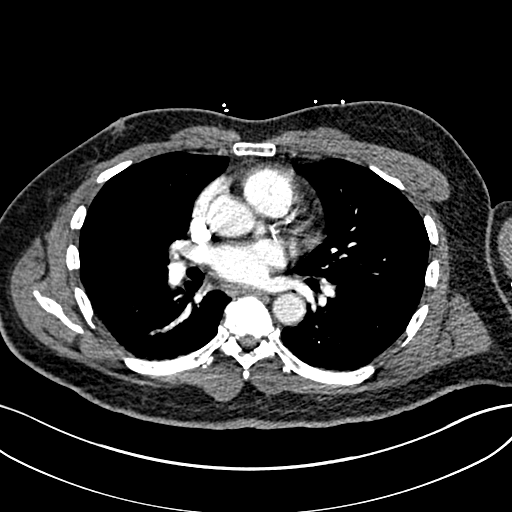
[im 143/286  lung]
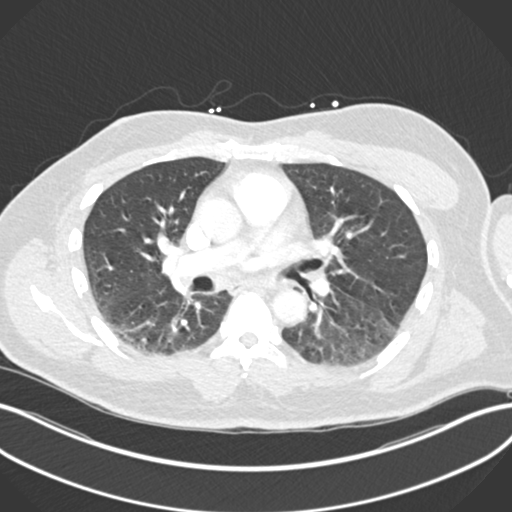
[im 159/286  mediastinal]
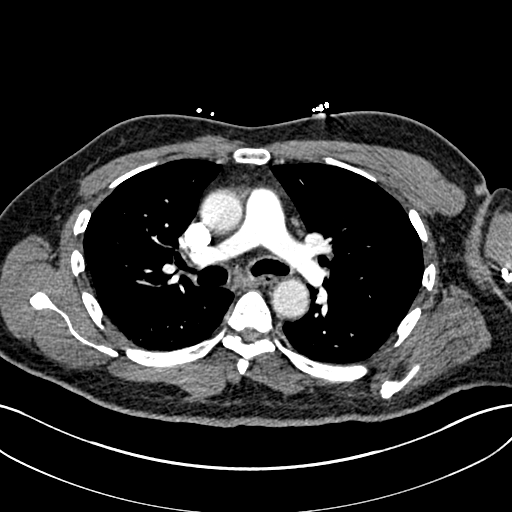
[im 175/286  lung]
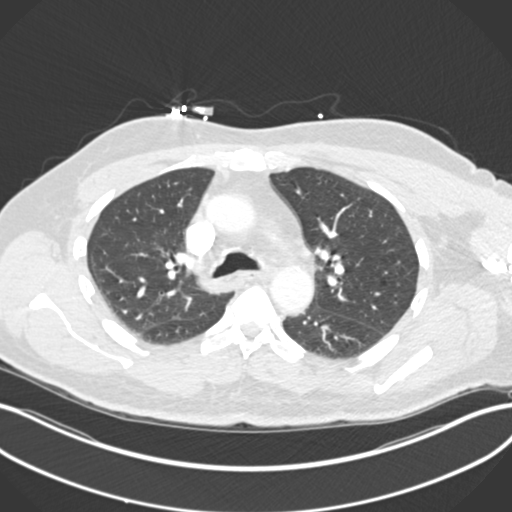
[im 191/286  mediastinal]
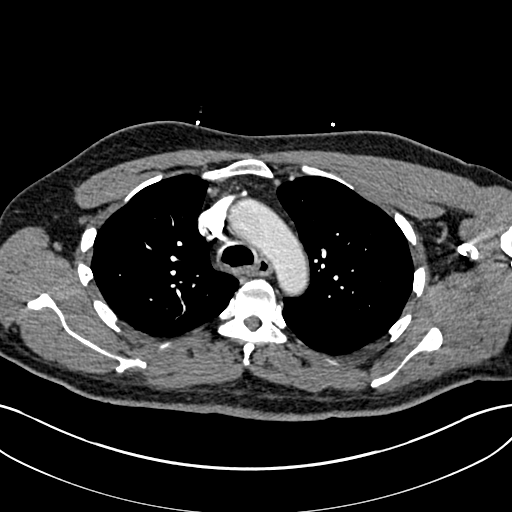
[im 206/286  lung]
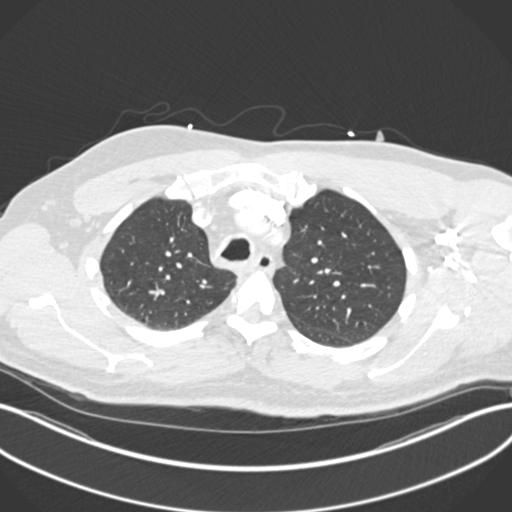
[im 222/286  mediastinal]
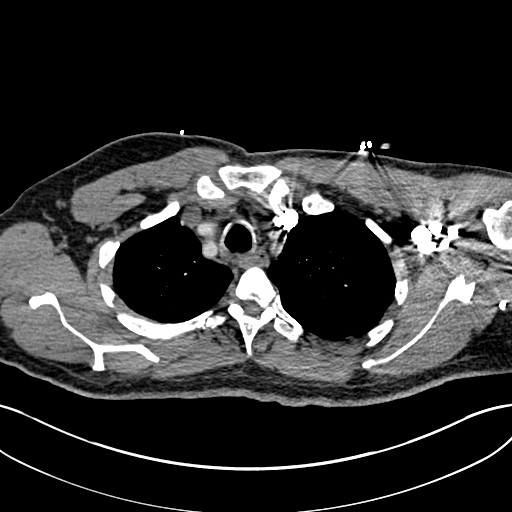
[im 238/286  lung]
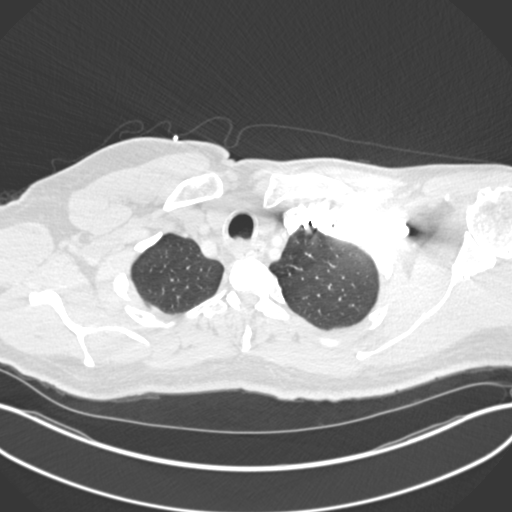
[im 254/286  mediastinal]
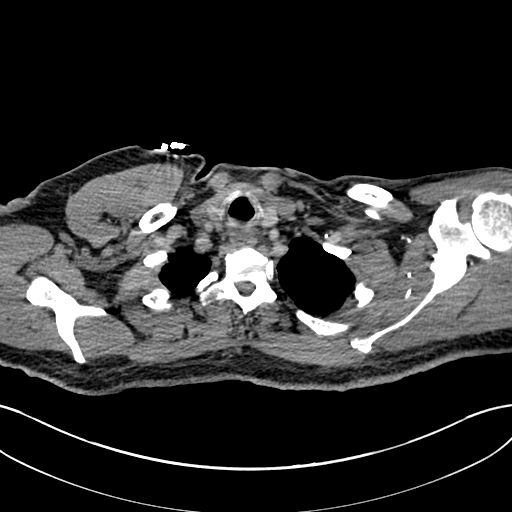
[im 270/286  lung]
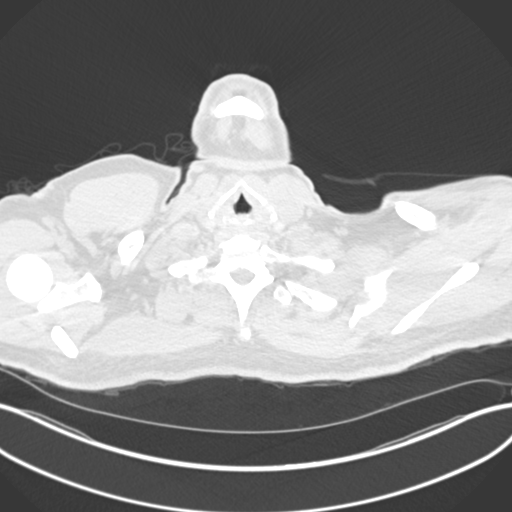

[Series 7: coronal mpr · coronal · 0.60mm/px · 1 of 151 slices shown]
[im 76/151  mediastinal]
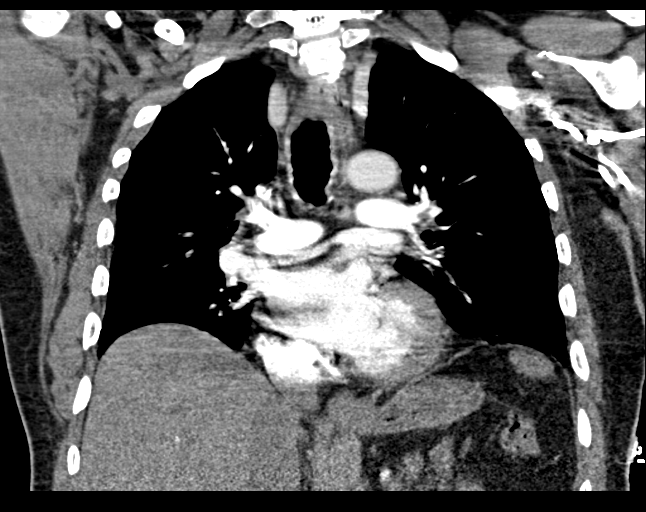

[18 of 36 positions shown; findings below may reference images not displayed]

FINDINGS: Cardiovascular: Satisfactory opacification of the pulmonary arteries
to the segmental level. No evidence of pulmonary embolism. Normal
heart size. No pericardial effusion.

Mediastinum/Nodes: No enlarged mediastinal, hilar, or axillary lymph
nodes. Thyroid gland, trachea, and esophagus demonstrate no
significant findings.

Lungs/Pleura: Bibasilar subsegmental atelectasis. No focal
consolidations. No pleural effusion or pneumothorax. There trace
layering secretions within the distal trachea.

Upper Abdomen: The visualized upper abdomen is within normal limits.

Musculoskeletal: No chest wall abnormality. No acute or significant
osseous findings.

Review of the MIP images confirms the above findings.
IMPRESSION: Vascular:

No evidence of pulmonary embolism.

Non-Vascular:

Bibasilar subsegmental atelectasis.

## 2023-09-23 DIAGNOSIS — Z0000 Encounter for general adult medical examination without abnormal findings: Secondary | ICD-10-CM | POA: Diagnosis not present

## 2023-09-23 DIAGNOSIS — Z1331 Encounter for screening for depression: Secondary | ICD-10-CM | POA: Diagnosis not present

## 2023-09-23 DIAGNOSIS — D649 Anemia, unspecified: Secondary | ICD-10-CM | POA: Diagnosis not present

## 2023-09-23 DIAGNOSIS — Z125 Encounter for screening for malignant neoplasm of prostate: Secondary | ICD-10-CM | POA: Diagnosis not present

## 2023-09-23 DIAGNOSIS — E782 Mixed hyperlipidemia: Secondary | ICD-10-CM | POA: Diagnosis not present

## 2023-09-23 DIAGNOSIS — E1169 Type 2 diabetes mellitus with other specified complication: Secondary | ICD-10-CM | POA: Diagnosis not present

## 2023-09-23 DIAGNOSIS — N529 Male erectile dysfunction, unspecified: Secondary | ICD-10-CM | POA: Diagnosis not present

## 2023-09-23 DIAGNOSIS — I10 Essential (primary) hypertension: Secondary | ICD-10-CM | POA: Diagnosis not present

## 2023-10-06 DIAGNOSIS — L731 Pseudofolliculitis barbae: Secondary | ICD-10-CM | POA: Diagnosis not present

## 2023-10-06 DIAGNOSIS — L811 Chloasma: Secondary | ICD-10-CM | POA: Diagnosis not present

## 2023-11-30 DIAGNOSIS — I10 Essential (primary) hypertension: Secondary | ICD-10-CM | POA: Diagnosis not present

## 2024-01-21 IMAGING — US US ABDOMINAL AORTA SCREENING AAA
1 series · 14 of 19 positions shown · non-contrast
Comparison: None.

CLINICAL DATA: Male between 65-75 years of age with a smoking
history.

EXAM:
US ABDOMINAL AORTA MEDICARE SCREENING
TECHNIQUE: Ultrasound examination of the abdominal aorta was performed as a
screening evaluation for abdominal aortic aneurysm.

[Series 1: us abdominal aorta screening aaa · 0.26mm/px · 14 of 19 slices shown]
[im 1/19]
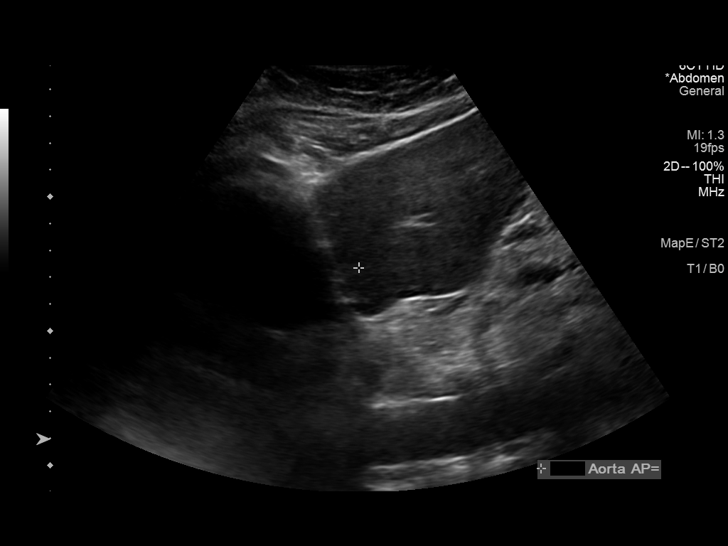
[im 3/19]
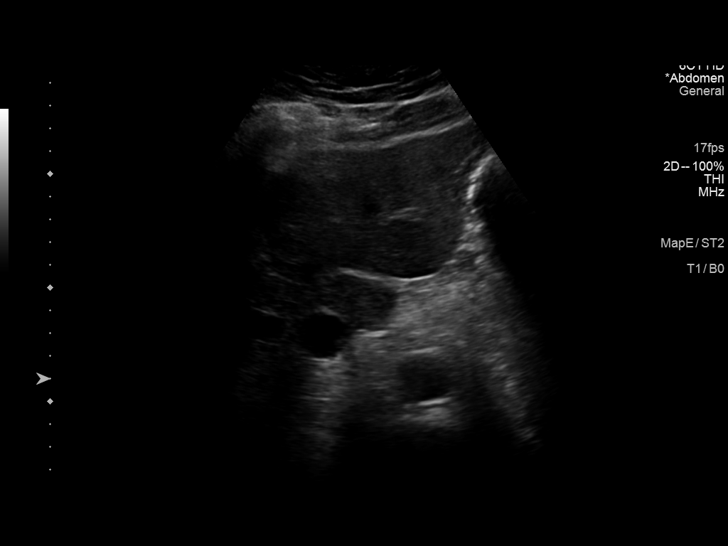
[im 4/19]
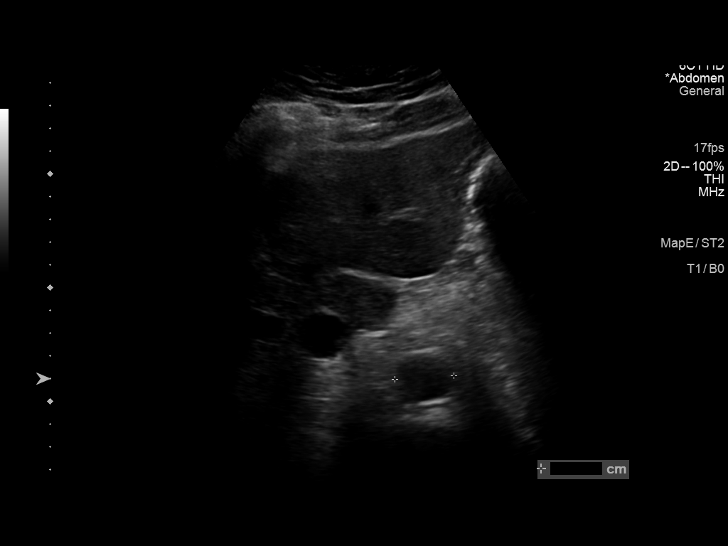
[im 5/19]
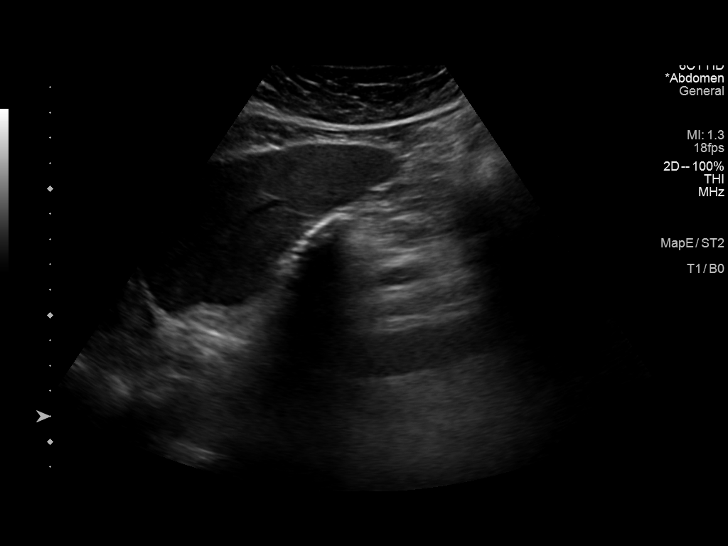
[im 7/19]
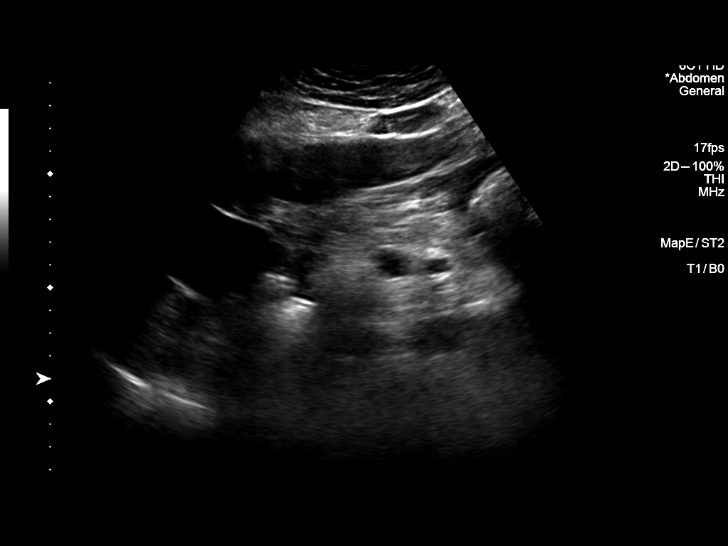
[im 8/19]
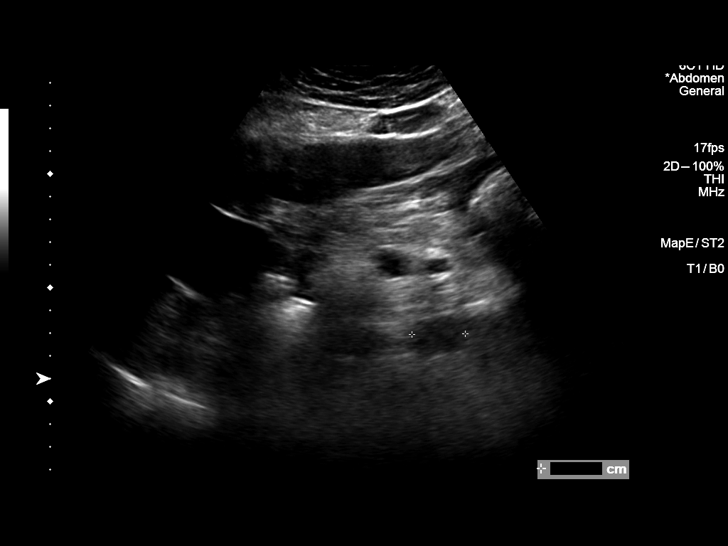
[im 9/19]
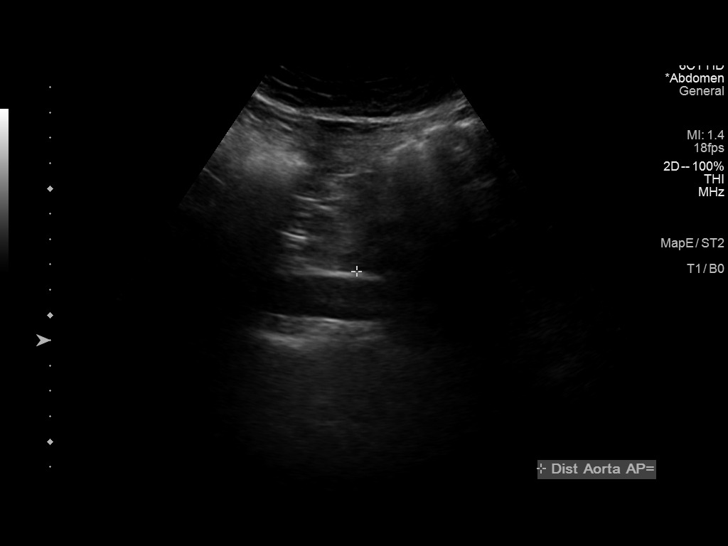
[im 11/19]
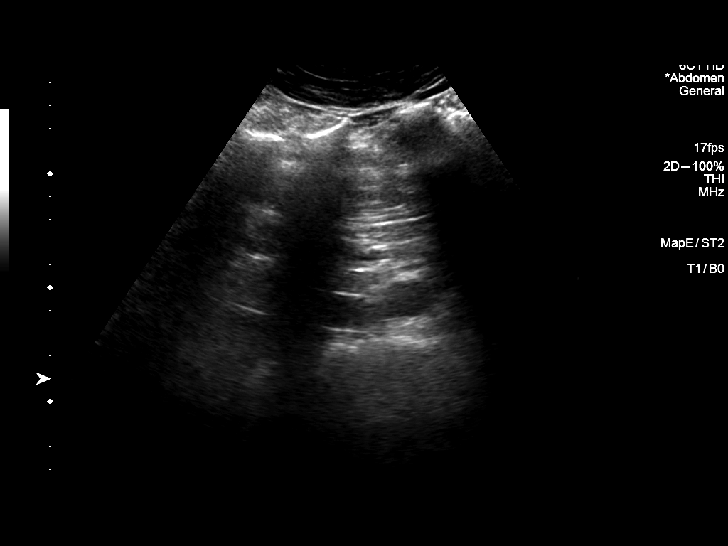
[im 12/19]
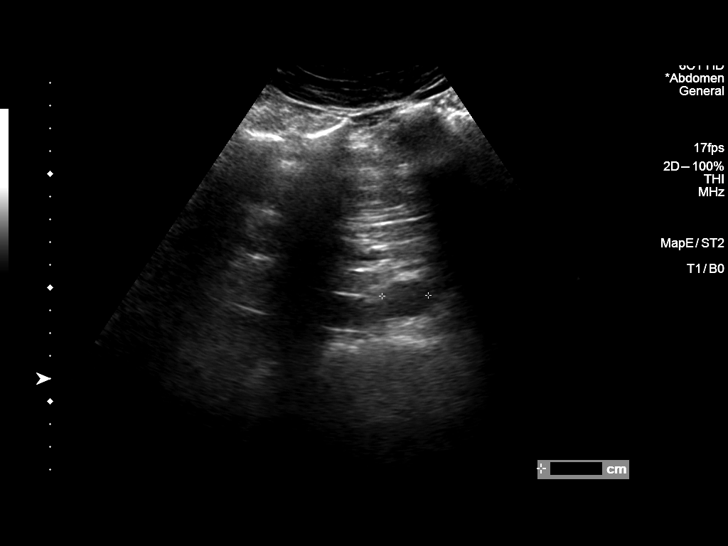
[im 13/19]
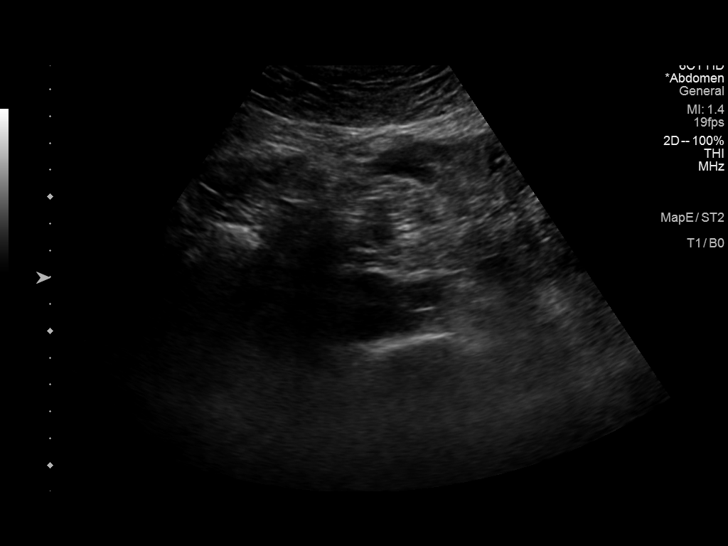
[im 15/19]
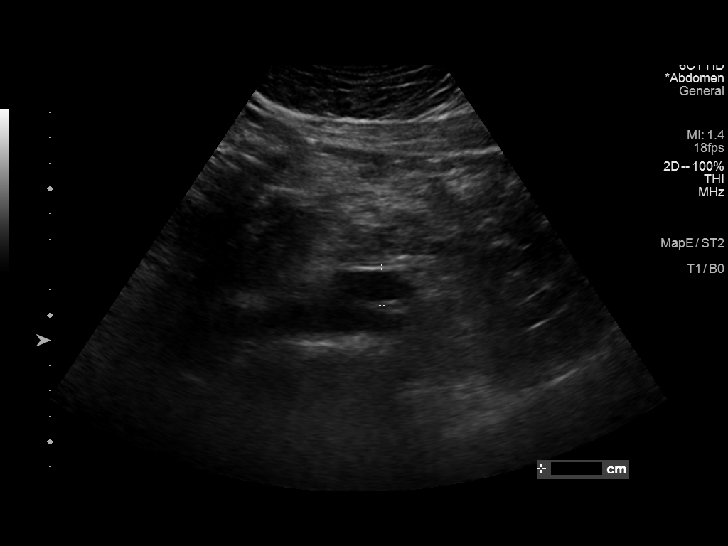
[im 16/19]
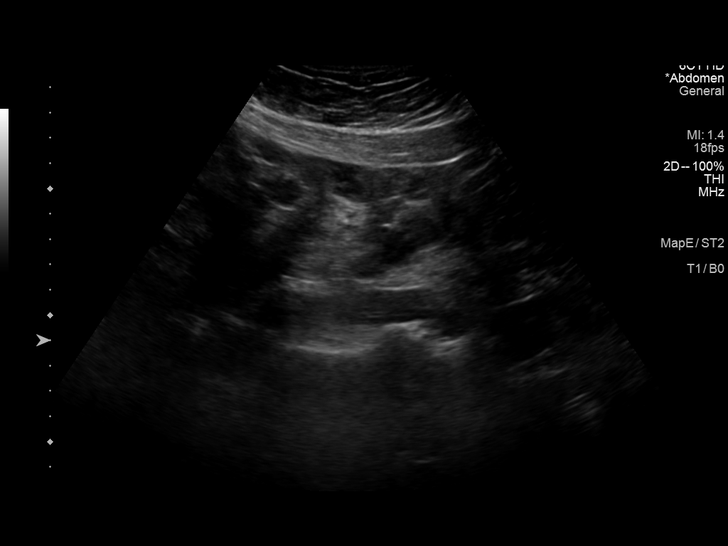
[im 17/19]
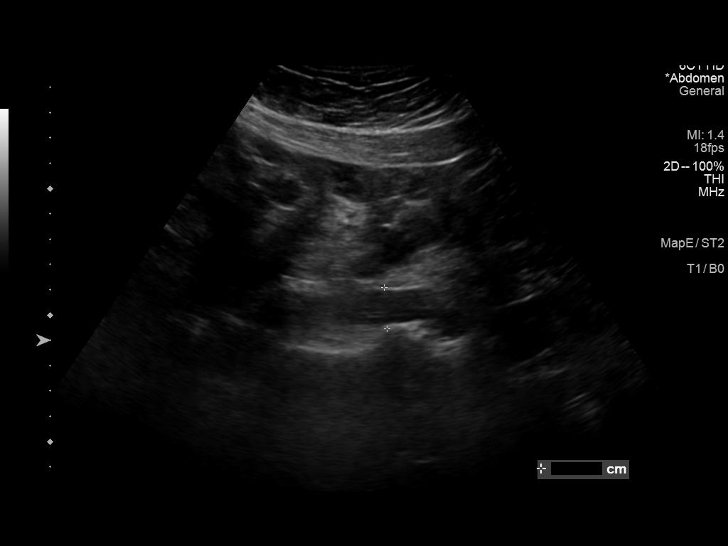
[im 19/19]
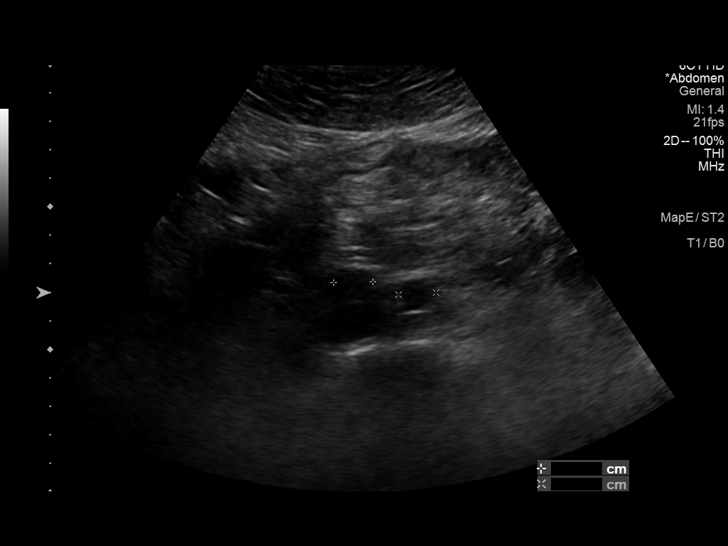

[14 of 19 positions shown; findings below may reference images not displayed]

FINDINGS: Abdominal aortic measurements as follows:

Proximal:  2.5 x 2.6 cm

Mid:  2.0 x 2.3 cm

Distal:  2.0 x 2.2 cm

No significant visualized atherosclerosis.
IMPRESSION: No evidence of abdominal aortic aneurysm.

## 2024-02-28 DIAGNOSIS — R0981 Nasal congestion: Secondary | ICD-10-CM | POA: Diagnosis not present

## 2024-02-28 DIAGNOSIS — U071 COVID-19: Secondary | ICD-10-CM | POA: Diagnosis not present

## 2024-02-28 DIAGNOSIS — R051 Acute cough: Secondary | ICD-10-CM | POA: Diagnosis not present

## 2024-02-28 DIAGNOSIS — H1033 Unspecified acute conjunctivitis, bilateral: Secondary | ICD-10-CM | POA: Diagnosis not present

## 2024-03-12 DIAGNOSIS — Z23 Encounter for immunization: Secondary | ICD-10-CM | POA: Diagnosis not present

## 2024-03-17 DIAGNOSIS — L811 Chloasma: Secondary | ICD-10-CM | POA: Diagnosis not present

## 2024-03-22 DIAGNOSIS — I10 Essential (primary) hypertension: Secondary | ICD-10-CM | POA: Diagnosis not present

## 2024-03-22 DIAGNOSIS — D649 Anemia, unspecified: Secondary | ICD-10-CM | POA: Diagnosis not present

## 2024-03-22 DIAGNOSIS — E1169 Type 2 diabetes mellitus with other specified complication: Secondary | ICD-10-CM | POA: Diagnosis not present

## 2024-03-22 DIAGNOSIS — N529 Male erectile dysfunction, unspecified: Secondary | ICD-10-CM | POA: Diagnosis not present

## 2024-03-22 DIAGNOSIS — E782 Mixed hyperlipidemia: Secondary | ICD-10-CM | POA: Diagnosis not present

## 2024-05-03 DIAGNOSIS — E119 Type 2 diabetes mellitus without complications: Secondary | ICD-10-CM | POA: Diagnosis not present
